# Patient Record
Sex: Female | Born: 1965
Health system: Southern US, Community
[De-identification: ages and names within clinical notes are randomized; demographics above are authoritative.]

## PROBLEM LIST (undated history)

## (undated) DIAGNOSIS — E785 Hyperlipidemia, unspecified: Secondary | ICD-10-CM

## (undated) DIAGNOSIS — J189 Pneumonia, unspecified organism: Secondary | ICD-10-CM

## (undated) DIAGNOSIS — K649 Unspecified hemorrhoids: Secondary | ICD-10-CM

## (undated) DIAGNOSIS — K219 Gastro-esophageal reflux disease without esophagitis: Secondary | ICD-10-CM

## (undated) HISTORY — DX: Hyperlipidemia, unspecified: E78.5

## (undated) HISTORY — DX: Unspecified hemorrhoids: K64.9

## (undated) HISTORY — DX: Gastro-esophageal reflux disease without esophagitis: K21.9

## (undated) HISTORY — PX: COLONOSCOPY: SHX174

## (undated) HISTORY — DX: Pneumonia, unspecified organism: J18.9

---

## 2006-05-27 ENCOUNTER — Emergency Department: Payer: Self-pay | Admitting: Emergency Medicine

## 2008-03-15 HISTORY — PX: PARTIAL HYSTERECTOMY: SHX80

## 2008-06-07 ENCOUNTER — Ambulatory Visit: Payer: Self-pay | Admitting: Unknown Physician Specialty

## 2008-06-13 ENCOUNTER — Ambulatory Visit: Payer: Self-pay | Admitting: Unknown Physician Specialty

## 2008-12-02 ENCOUNTER — Emergency Department: Payer: Self-pay | Admitting: Emergency Medicine

## 2009-11-13 ENCOUNTER — Ambulatory Visit: Payer: Self-pay | Admitting: Gastroenterology

## 2012-07-21 ENCOUNTER — Ambulatory Visit: Payer: Self-pay | Admitting: Family

## 2013-11-29 ENCOUNTER — Ambulatory Visit: Payer: Self-pay | Admitting: Family Medicine

## 2014-01-03 ENCOUNTER — Telehealth: Payer: Self-pay | Admitting: *Deleted

## 2014-01-03 ENCOUNTER — Encounter: Payer: Self-pay | Admitting: General Surgery

## 2014-01-03 NOTE — Telephone Encounter (Signed)
Needs appt per Dr Evette CristalSankar

## 2014-01-17 ENCOUNTER — Encounter: Payer: Self-pay | Admitting: General Surgery

## 2014-01-17 ENCOUNTER — Ambulatory Visit (INDEPENDENT_AMBULATORY_CARE_PROVIDER_SITE_OTHER): Payer: PRIVATE HEALTH INSURANCE | Admitting: General Surgery

## 2014-01-17 VITALS — BP 100/64 | HR 70 | Resp 12 | Ht 66.0 in | Wt 149.0 lb

## 2014-01-17 DIAGNOSIS — K648 Other hemorrhoids: Secondary | ICD-10-CM

## 2014-01-17 DIAGNOSIS — K644 Residual hemorrhoidal skin tags: Secondary | ICD-10-CM

## 2014-01-17 DIAGNOSIS — R58 Hemorrhage, not elsewhere classified: Secondary | ICD-10-CM

## 2014-01-17 MED ORDER — HYDROCORTISONE ACE-PRAMOXINE 2.5-1 % RE CREA: 1.0000 "application " | TOPICAL_CREAM | Freq: Two times a day (BID) | RECTAL | Status: DC

## 2014-01-17 NOTE — Progress Notes (Signed)
Patient ID: Charlotte Thompson, female   DOB: 09/23/1965, 48 y.o.   MRN: [id number]  Chief Complaint  Patient presents with  . Hemorrhoids    HPI Charlotte EpleySusan L Longbottom is a 48 y.o. female.  Here today for evaluation of bleeding hemorrhoids. She states she has had issues with them for many years. She has used the analpram cream and had run out. She does have issues with constipation and takes [place]amitiza which helps and her bowels move daily.   HPI  Past Medical History  Diagnosis Date  . Pneumonia   . GERD (gastroesophageal reflux disease)   . Hemorrhoid   . Hyperlipidemia     Past Surgical History  Procedure Laterality Date  . Partial hysterectomy  2010  . Colonoscopy      History reviewed. No pertinent family history.  Social History History  Substance Use Topics  . Smoking status: Former Smoker -- 16 years    Quit date: 03/15/2012  . Smokeless tobacco: Not on file  . Alcohol Use: No    Allergies  Allergen Reactions  . Codeine     Current Outpatient Prescriptions  Medication Sig Dispense Refill  . citalopram (CELEXA) 20 MG tablet Take 20 mg by mouth daily.    . Diphenhydramine-APAP, sleep, (PAIN RELIEF-SLEEP PM PO) Take by mouth as needed.    Tery Sanfilippo. Docusate Calcium (STOOL SOFTENER PO) Take by mouth as needed.    Marland Kitchen. levothyroxine (SYNTHROID, LEVOTHROID) 137 MCG tablet Take 137 mcg by mouth daily before breakfast.    . lubiprostone (AMITIZA) 24 MCG capsule Take 24 mcg by mouth 2 (two) times daily with a meal.    . Multiple Vitamin (MULTIVITAMIN) capsule Take 1 capsule by mouth daily.    . simvastatin (ZOCOR) 20 MG tablet Take 20 mg by mouth daily at 6 PM.    . hydrocortisone-pramoxine (ANALPRAM HC) 2.5-1 % rectal cream Place 1 application rectally 2 (two) times daily. 30 g 2   No current facility-administered medications for this visit.    Review of Systems Review of Systems  Constitutional: Negative.   Respiratory: Negative.   Cardiovascular: Negative.   Gastrointestinal:  Positive for constipation and blood in stool. Negative for diarrhea.    Blood pressure 100/64, pulse 70, resp. rate 12, height 5\' 6"  (1.676 m), weight 149 lb (67.586 kg).  Physical Exam Physical Exam  Constitutional: She is oriented to person, place, and time. She appears well-developed and well-nourished.  Eyes: Conjunctivae are normal. No scleral icterus.  Neck: Neck supple.  Genitourinary: Rectal exam shows external hemorrhoid and internal hemorrhoid.  External hemorrhoid at 11 o'clock accompanied by partially scarred internal hemorrhoid.  Lymphadenopathy:    She has no cervical adenopathy.  Neurological: She is alert and oriented to person, place, and time.  Skin: Skin is warm and dry.    Data Reviewed Office notes.  Assessment    External and internal hemorrhoid.     Plan    Discussed options- rubber band ligation or excision of both internal and external hemorrhoid. Pt will think about it. RX for Analpram 2.5 % cream as needed.      PCP:  Claudia PollockSundaram, Ashany Choua Ikner G 01/18/2014, 6:43 AM

## 2014-01-17 NOTE — Patient Instructions (Signed)
The patient is aware to call back for any questions or concerns.  

## 2014-01-18 ENCOUNTER — Encounter: Payer: Self-pay | Admitting: General Surgery

## 2014-01-23 ENCOUNTER — Ambulatory Visit (INDEPENDENT_AMBULATORY_CARE_PROVIDER_SITE_OTHER): Payer: PRIVATE HEALTH INSURANCE | Admitting: General Surgery

## 2014-01-23 ENCOUNTER — Encounter: Payer: Self-pay | Admitting: General Surgery

## 2014-01-23 VITALS — BP 130/72 | HR 74 | Resp 12 | Ht 66.0 in | Wt 149.0 lb

## 2014-01-23 DIAGNOSIS — K648 Other hemorrhoids: Secondary | ICD-10-CM

## 2014-01-23 DIAGNOSIS — K649 Unspecified hemorrhoids: Secondary | ICD-10-CM

## 2014-01-23 NOTE — Progress Notes (Signed)
This is a 48 year old female here today for hemorrhoid banding. Pt had mild improvement of symptom with Analpram.  She decided to have the internal hemorrhoid banded.  Pt in left lateral position. Anal area prepped with betadine. Internal hemorrhoid base at 11 ocl was infiltrated with 1 ml 1% xylocaine. The hemorrhoid was successfully banded.  No immediate problems.

## 2014-01-23 NOTE — Patient Instructions (Signed)
Patient to return in 3 weeks for follow up. The patient is aware to call back for any questions or concerns.  

## 2014-01-24 ENCOUNTER — Encounter: Payer: Self-pay | Admitting: General Surgery

## 2014-02-13 ENCOUNTER — Ambulatory Visit: Payer: PRIVATE HEALTH INSURANCE | Admitting: General Surgery

## 2014-08-30 ENCOUNTER — Other Ambulatory Visit: Payer: Self-pay

## 2014-08-30 ENCOUNTER — Telehealth: Payer: Self-pay | Admitting: Family Medicine

## 2014-08-30 DIAGNOSIS — N951 Menopausal and female climacteric states: Secondary | ICD-10-CM

## 2014-08-30 NOTE — Telephone Encounter (Signed)
Pt requesting a refill estradol. Please send to [facility]. She is completely out and there are no more refills at the pharmacy

## 2014-09-02 DIAGNOSIS — N951 Menopausal and female climacteric states: Secondary | ICD-10-CM | POA: Insufficient documentation

## 2014-09-02 MED ORDER — ESTRADIOL 0.5 MG PO TABS: 0.5000 mg | ORAL_TABLET | Freq: Every day | ORAL | Status: DC

## 2014-09-02 NOTE — Telephone Encounter (Signed)
Done

## 2014-10-18 ENCOUNTER — Ambulatory Visit (INDEPENDENT_AMBULATORY_CARE_PROVIDER_SITE_OTHER): Payer: PRIVATE HEALTH INSURANCE | Admitting: Family Medicine

## 2014-10-18 ENCOUNTER — Encounter: Payer: Self-pay | Admitting: Family Medicine

## 2014-10-18 VITALS — BP 102/68 | HR 67 | Temp 97.7°F | Resp 16 | Ht 66.0 in | Wt 146.9 lb

## 2014-10-18 DIAGNOSIS — L729 Follicular cyst of the skin and subcutaneous tissue, unspecified: Secondary | ICD-10-CM

## 2014-10-18 HISTORY — DX: Follicular cyst of the skin and subcutaneous tissue, unspecified: L72.9

## 2014-10-18 NOTE — Progress Notes (Signed)
Name: Charlotte Thompson   MRN: [id number]    DOB: 01/08/1966   Date:10/18/2014       Progress Note  Subjective  Chief Complaint  Chief Complaint  Patient presents with  . Cyst    patient has a knot on the left lateral side of her knee that she just noticed yesterday. Patient is concerned because her mother has a clotting disorder and she wants to make sure she does not have it also.    HPI  50 year old female with acute concern she recently felt a tender knot behind left knee to the lateral side. Her mother has had a clotting disorder so she wanted to have it evaluated. Not associated with claudication, warm, swelling, erythema, shortness of breath, recent trauma to the area.   Patient Active Problem List   Diagnosis Date Noted  . Hot flashes, menopausal 09/02/2014    History  Substance Use Topics  . Smoking status: Former Smoker -- 16 years    Quit date: 03/15/2012  . Smokeless tobacco: Not on file  . Alcohol Use: No     Current outpatient prescriptions:  .  citalopram (CELEXA) 20 MG tablet, Take 20 mg by mouth daily., Disp: , Rfl:  .  Diphenhydramine-APAP, sleep, (PAIN RELIEF-SLEEP PM PO), Take by mouth as needed., Disp: , Rfl:  .  hydrocortisone-pramoxine (ANALPRAM HC) 2.5-1 % rectal cream, Place 1 application rectally 2 (two) times daily., Disp: 30 g, Rfl: 2 .  levothyroxine (SYNTHROID, LEVOTHROID) 137 MCG tablet, Take 137 mcg by mouth daily before breakfast., Disp: , Rfl:  .  lubiprostone (AMITIZA) 24 MCG capsule, Take 24 mcg by mouth 2 (two) times daily with a meal., Disp: , Rfl:  .  Multiple Vitamin (MULTIVITAMIN) capsule, Take 1 capsule by mouth daily., Disp: , Rfl:  .  simvastatin (ZOCOR) 20 MG tablet, Take 20 mg by mouth daily at 6 PM., Disp: , Rfl:  .  VITAMIN D, ERGOCALCIFEROL, PO, Take by mouth., Disp: , Rfl:  .  estradiol (ESTRACE) 0.5 MG tablet, Take 1 tablet (0.5 mg total) by mouth daily., Disp: 21 tablet, Rfl: 5  Allergies  Allergen Reactions  . Codeine      Review of Systems  Ten systems reviewed and is negative except as mentioned in HPI.  Objective  BP 102/68 mmHg  Pulse 67  Temp(Src) 97.7 F (36.5 C) (Oral)  Resp 16  Ht  (1.676 m)  Wt 146 lb 14.4 oz (66.633 kg)  BMI 23.72 kg/m2  SpO2 98%  Body mass index is 23.72 kg/(m^2).   Physical Exam  Constitutional: Patient appears well-developed and well-nourished. In no distress.  HEENT:  - Head: Normocephalic and atraumatic.  - Ears: Bilateral TMs gray, no erythema or effusion - Nose: Nasal mucosa moist - Mouth/Throat: Oropharynx is clear and moist. No tonsillar hypertrophy or erythema. No post nasal drainage.  - Eyes: Conjunctivae clear, EOM movements normal. PERRLA. No scleral icterus.  Neck: Normal range of motion. Neck supple. No JVD present. No thyromegaly present.  Cardiovascular: Normal rate, regular rhythm and normal heart sounds.  No murmur heard.  Pulmonary/Chest: Effort normal and breath sounds normal. No respiratory distress. Musculoskeletal: Normal range of motion bilateral UE and LE, no joint effusions. No calf tenderness bilaterally with no swelling or erythema. Left posterior to knee just lateral and over quadriceps tendon a 0.5cm soft mildly tender mass palpated, freely mobile.  Peripheral vascular: Bilateral LE no edema. Neurological: CN II-XII grossly intact with no focal deficits. Alert  and oriented to person, place, and time. Coordination, balance, strength, speech and gait are normal.  Skin: Skin is warm and dry. No rash noted. No erythema.  Psychiatric: Patient has a normal mood and affect. Behavior is normal in office today. Judgment and thought content normal in office today.   No results found for this or any previous visit (from the past 2160 hour(s)).   Assessment & Plan  1. Benign cyst of skin Exam unremarkable for DVT. Localized soft mobile cystic structure over quadriceps tendon of left leg. Reassurance provided and discussed signs and  symptoms of DVT for future reference.

## 2014-11-04 ENCOUNTER — Other Ambulatory Visit: Payer: Self-pay | Admitting: Family Medicine

## 2014-11-04 DIAGNOSIS — E039 Hypothyroidism, unspecified: Secondary | ICD-10-CM

## 2014-11-04 DIAGNOSIS — K581 Irritable bowel syndrome with constipation: Secondary | ICD-10-CM

## 2014-11-04 MED ORDER — LUBIPROSTONE 24 MCG PO CAPS: 24.0000 ug | ORAL_CAPSULE | Freq: Two times a day (BID) | ORAL | Status: DC

## 2014-11-04 MED ORDER — LEVOTHYROXINE SODIUM 137 MCG PO TABS: 137.0000 ug | ORAL_TABLET | Freq: Every day | ORAL | Status: DC

## 2014-11-04 NOTE — Telephone Encounter (Signed)
Pt needs refill on Levothyroxine, Amitiza to be sent to [facility]. Pt states that the pharmacy faxed over request and states that she was told that we denied it due to her not being a pt here. Pt states it is ok to leave a message on her phone due to her being at work.

## 2014-11-04 NOTE — Telephone Encounter (Signed)
Refill request was sent to Dr. Ashany Sundaram for approval and submission.  

## 2014-11-08 ENCOUNTER — Other Ambulatory Visit: Payer: Self-pay | Admitting: Family Medicine

## 2014-11-08 DIAGNOSIS — E785 Hyperlipidemia, unspecified: Secondary | ICD-10-CM

## 2014-11-08 MED ORDER — SIMVASTATIN 20 MG PO TABS: 20.0000 mg | ORAL_TABLET | Freq: Every day | ORAL | Status: DC

## 2014-11-08 NOTE — Telephone Encounter (Signed)
Refill request was sent to Dr. Ashany Sundaram for approval and submission.  

## 2014-11-08 NOTE — Telephone Encounter (Signed)
Patient is completely out of Simvastatin, have been out for 3-4 days. States the pharmacy has tried to fax the request over but they keep getting a reply back saying that she was not a patient here. Please send to [facility]. Last seen here 10-18-14

## 2015-01-28 ENCOUNTER — Other Ambulatory Visit: Payer: Self-pay | Admitting: Family Medicine

## 2015-01-28 MED ORDER — CITALOPRAM HYDROBROMIDE 20 MG PO TABS: 20.0000 mg | ORAL_TABLET | Freq: Every day | ORAL | Status: DC

## 2015-01-28 NOTE — Telephone Encounter (Signed)
Refill request was sent to Dr. Ashany Sundaram for approval and submission.  

## 2015-01-28 NOTE — Telephone Encounter (Signed)
Pt needs refill on [facility]italopram [facility]

## 2015-04-30 ENCOUNTER — Other Ambulatory Visit: Payer: Self-pay | Admitting: Family Medicine

## 2015-06-16 ENCOUNTER — Encounter: Payer: Self-pay | Admitting: Family Medicine

## 2015-06-16 ENCOUNTER — Ambulatory Visit (INDEPENDENT_AMBULATORY_CARE_PROVIDER_SITE_OTHER): Payer: PRIVATE HEALTH INSURANCE | Admitting: Family Medicine

## 2015-06-16 ENCOUNTER — Other Ambulatory Visit: Payer: Self-pay

## 2015-06-16 VITALS — BP 116/72 | HR 89 | Temp 98.6°F | Resp 14 | Ht 65.0 in | Wt 141.0 lb

## 2015-06-16 DIAGNOSIS — Z841 Family history of disorders of kidney and ureter: Secondary | ICD-10-CM | POA: Insufficient documentation

## 2015-06-16 DIAGNOSIS — E038 Other specified hypothyroidism: Secondary | ICD-10-CM | POA: Diagnosis not present

## 2015-06-16 DIAGNOSIS — E7800 Pure hypercholesterolemia, unspecified: Secondary | ICD-10-CM | POA: Insufficient documentation

## 2015-06-16 DIAGNOSIS — R454 Irritability and anger: Secondary | ICD-10-CM

## 2015-06-16 DIAGNOSIS — Z862 Personal history of diseases of the blood and blood-forming organs and certain disorders involving the immune mechanism: Secondary | ICD-10-CM

## 2015-06-16 DIAGNOSIS — N951 Menopausal and female climacteric states: Secondary | ICD-10-CM

## 2015-06-16 DIAGNOSIS — R011 Cardiac murmur, unspecified: Secondary | ICD-10-CM

## 2015-06-16 DIAGNOSIS — E039 Hypothyroidism, unspecified: Secondary | ICD-10-CM | POA: Insufficient documentation

## 2015-06-16 DIAGNOSIS — Z720 Tobacco use: Secondary | ICD-10-CM | POA: Diagnosis not present

## 2015-06-16 DIAGNOSIS — Z5181 Encounter for therapeutic drug level monitoring: Secondary | ICD-10-CM

## 2015-06-16 HISTORY — DX: Family history of disorders of kidney and ureter: Z84.1

## 2015-06-16 HISTORY — DX: Cardiac murmur, unspecified: R01.1

## 2015-06-16 HISTORY — DX: Personal history of diseases of the blood and blood-forming organs and certain disorders involving the immune mechanism: Z86.2

## 2015-06-16 MED ORDER — CITALOPRAM HYDROBROMIDE 40 MG PO TABS: 40.0000 mg | ORAL_TABLET | Freq: Every day | ORAL | Status: DC

## 2015-06-16 NOTE — Patient Instructions (Addendum)
Let's get labs today We'll contact you with the results Try to limit saturated fats in your diet (bologna, hot dogs, barbeque, cheeseburgers, hamburgers, steak, bacon, sausage, cheese, etc.) and get more fresh fruits, vegetables, and whole grains Increase the citalopram and I'll see you back in 4 weeks, but call sooner if needed I do encourage you to quit smoking Call [phone] to sign up for smoking cessation classes You can call 1-800-QUIT-NOW to talk with a smoking cessation coach  Smoking Cessation, Tips for Success If you are ready to quit smoking, congratulations! You have chosen to help yourself be healthier. Cigarettes bring nicotine, tar, carbon monoxide, and other irritants into your body. Your lungs, heart, and blood vessels will be able to work better without these poisons. There are many different ways to quit smoking. Nicotine gum, nicotine patches, a nicotine inhaler, or nicotine nasal spray can help with physical craving. Hypnosis, support groups, and medicines help break the habit of smoking. WHAT THINGS CAN I DO TO MAKE QUITTING EASIER?  Here are some tips to help you quit for good:  Pick a date when you will quit smoking completely. Tell all of your friends and family about your plan to quit on that date.  Do not try to slowly cut down on the number of cigarettes you are smoking. Pick a quit date and quit smoking completely starting on that day.  Throw away all cigarettes.   Clean and remove all ashtrays from your home, work, and car.  On a card, write down your reasons for quitting. Carry the card with you and read it when you get the urge to smoke.  Cleanse your body of nicotine. Drink enough water and fluids to keep your urine clear or pale yellow. Do this after quitting to flush the nicotine from your body.  Learn to predict your moods. Do not let a bad situation be your excuse to have a cigarette. Some situations in your life might tempt you into wanting a  cigarette.  Never have "just one" cigarette. It leads to wanting another and another. Remind yourself of your decision to quit.  Change habits associated with smoking. If you smoked while driving or when feeling stressed, try other activities to replace smoking. Stand up when drinking your coffee. Brush your teeth after eating. Sit in a different chair when you read the paper. Avoid alcohol while trying to quit, and try to drink fewer caffeinated beverages. Alcohol and caffeine may urge you to smoke.  Avoid foods and drinks that can trigger a desire to smoke, such as sugary or spicy foods and alcohol.  Ask people who smoke not to smoke around you.  Have something planned to do right after eating or having a cup of coffee. For example, plan to take a walk or exercise.  Try a relaxation exercise to calm you down and decrease your stress. Remember, you may be tense and nervous for the first 2 weeks after you quit, but this will pass.  Find new activities to keep your hands busy. Play with a pen, coin, or rubber band. Doodle or draw things on paper.  Brush your teeth right after eating. This will help cut down on the craving for the taste of tobacco after meals. You can also try mouthwash.   Use oral substitutes in place of cigarettes. Try using lemon drops, carrots, cinnamon sticks, or chewing gum. Keep them handy so they are available when you have the urge to smoke.  When you have the  urge to smoke, try deep breathing.  Designate your home as a nonsmoking area.  If you are a heavy smoker, ask your health care provider about a prescription for nicotine chewing gum. It can ease your withdrawal from nicotine.  Reward yourself. Set aside the cigarette money you save and buy yourself something nice.  Look for support from others. Join a support group or smoking cessation program. Ask someone at home or at work to help you with your plan to quit smoking.  Always ask yourself, "Do I need this  cigarette or is this just a reflex?" Tell yourself, "Today, I choose not to smoke," or "I do not want to smoke." You are reminding yourself of your decision to quit.  Do not replace cigarette smoking with electronic cigarettes (commonly called e-cigarettes). The safety of e-cigarettes is unknown, and some may contain harmful chemicals.  If you relapse, do not give up! Plan ahead and think about what you will do the next time you get the urge to smoke. HOW WILL I FEEL WHEN I QUIT SMOKING? You may have symptoms of withdrawal because your body is used to nicotine (the addictive substance in cigarettes). You may crave cigarettes, be irritable, feel very hungry, cough often, get headaches, or have difficulty concentrating. The withdrawal symptoms are only temporary. They are strongest when you first quit but will go away within 10-14 days. When withdrawal symptoms occur, stay in control. Think about your reasons for quitting. Remind yourself that these are signs that your body is healing and getting used to being without cigarettes. Remember that withdrawal symptoms are easier to treat than the major diseases that smoking can cause.  Even after the withdrawal is over, expect periodic urges to smoke. However, these cravings are generally short lived and will go away whether you smoke or not. Do not smoke! WHAT RESOURCES ARE AVAILABLE TO HELP ME QUIT SMOKING? Your health care provider can direct you to community resources or hospitals for support, which may include:  Group support.  Education.  Hypnosis.  Therapy.   This information is not intended to replace advice given to you by your health care provider. Make sure you discuss any questions you have with your health care provider.   Document Released: 11/28/2003 Document Revised: 03/22/2014 Document Reviewed: 08/17/2012 Elsevier Interactive Patient Education [employer]

## 2015-06-16 NOTE — Progress Notes (Signed)
BP 116/72 mmHg  Pulse 89  Temp(Src) 98.6 F (37 C) (Oral)  Resp 14  Ht  (1.651 m)  Wt 141 lb (63.957 kg)  BMI 23.46 kg/m2  SpO2 98%   Subjective:    Patient ID: Charlotte Thompson, female    DOB: 1965-08-22, 50 y.o.   MRN: [id number]  HPI: Charlotte Thompson is a 50 y.o. female  Chief Complaint  Patient presents with  . Medication Refill  . Hypothyroidism    Having alot of Fatigue levels may be off  . Depression    citalopram not helping  . Hyperlipidemia    She would like to have her thyroid checked; no energy to do anything; has been on the same dose of thyroid medicine for 10 years; on medicine after childbirth 23 years ago; uses Amitiza; no changes in hair or skin; sleep has been poor; using otc; more trouble with staying asleep; can feel asleep   Doesn't think the citalopram may not be working; so ill, I can't stand myself much less other people; has been on 20 mg citalopram for a while; several years; no side effects; she is game to increasing it  She started taking smoking again, one year ago; stopped estrogen because she has heard about risks  High cholesterol; on statin but has muscle aches really bad; quit smoking and then her cholesterol went sky high; also coincided with onset of menopause; trying to eat healthy; fruits, peanut butter  Relevant past medical, surgical, family and social history reviewed and updated as indicated  Family History  Problem Relation Age of Onset  . Clotting disorder Mother   . Thyroid disease Sister   mother is on dialysis, Dr. Thedore Mins; brother has renal disease as well High cholesterol runs in the family Sister has breast cancer  Allergies and medications reviewed and updated.  Review of Systems Per HPI unless specifically indicated above     Objective:    BP 116/72 mmHg  Pulse 89  Temp(Src) 98.6 F (37 C) (Oral)  Resp 14  Ht  (1.651 m)  Wt 141 lb (63.957 kg)  BMI 23.46 kg/m2  SpO2 98%  Wt Readings from Last  3 Encounters:  06/16/15 141 lb (63.957 kg)  10/18/14 146 lb 14.4 oz (66.633 kg)  01/23/14 149 lb (67.586 kg)    Physical Exam  Constitutional: She appears well-developed and well-nourished.  Weight down 5 pounds over last 8 months  Eyes: No scleral icterus.  Neck: No JVD present. No thyromegaly present.  Cardiovascular: Normal rate and regular rhythm.   Murmur heard.  Systolic murmur is present with a grade of 2/6  Pulmonary/Chest: Effort normal and breath sounds normal.  Musculoskeletal: She exhibits no edema.  Neurological: She is alert. She displays no tremor.  No tics  Skin: Skin is warm and dry. No erythema. No pallor.  Psychiatric: She has a normal mood and affect.      Assessment & Plan:   Problem List Items Addressed This Visit      Endocrine   Hypothyroidism - Primary    Check TSH; she has had weight loss and fatigue; adjust dose of medicine if indicated      Relevant Orders   TSH     Other   Hot flashes, menopausal (Chronic)    Increase the SSRI, may have some benefit here      Tobacco abuse    Encourage dpatient to consider quitting; I am here to help if I may  serve in that capacity      Hypercholesterolemia    Check lipid panel      Relevant Orders   Lipid Panel w/o Chol/HDL Ratio   Medication monitoring encounter   Relevant Orders   Comprehensive metabolic panel   Family history of chronic renal disease    Check creatinine and urine microalbumin      Relevant Orders   Microalbumin / creatinine urine ratio   Hx of iron deficiency anemia   Relevant Orders   CBC with Differential/Platelet   Heart murmur    discussed benign sounding murmur with patient; she does not recall ever having had an echo, so we'll order one today; also order TSH and CBC (r/o hyperthyroidism and anemia)      Relevant Orders   Echocardiogram   Irritability    Increase SSRI; reassess in 4 weeks         Follow up plan: Return in about 4 weeks (around 07/14/2015) for  follow-up.  Orders Placed This Encounter  Procedures  . CBC with Differential/Platelet  . Lipid Panel w/o Chol/HDL Ratio  . TSH  . Microalbumin / creatinine urine ratio  . Comprehensive metabolic panel  . Echocardiogram   An after-visit summary was printed and given to the patient at check-out.  Please see the patient instructions which may contain other information and recommendations beyond what is mentioned above in the assessment and plan.  Meds ordered this encounter  Medications  . Ginger, Zingiber officinalis, (GINGER ROOT) 550 MG CAPS    Sig: Take 550 mg by mouth daily.  . citalopram (CELEXA) 40 MG tablet    Sig: Take 1 tablet (40 mg total) by mouth daily.    Dispense:  30 tablet    Refill:  3

## 2015-06-17 LAB — COMPREHENSIVE METABOLIC PANEL
ALT: 22 IU/L (ref 0–32)
AST: 23 IU/L (ref 0–40)
Albumin/Globulin Ratio: 2 (ref 1.2–2.2)
Albumin: 4.3 g/dL (ref 3.5–5.5)
Alkaline Phosphatase: 88 IU/L (ref 39–117)
BUN/Creatinine Ratio: 24 — ABNORMAL HIGH (ref 9–23)
BUN: 15 mg/dL (ref 6–24)
Bilirubin Total: <0.2 mg/dL (ref 0.0–1.2)
CO2: 24 mmol/L (ref 18–29)
Calcium: 9.9 mg/dL (ref 8.7–10.2)
Chloride: 103 mmol/L (ref 96–106)
Creatinine, Ser: 0.62 mg/dL (ref 0.57–1.00)
GFR calc Af Amer: 123 mL/min/{1.73_m2} (ref >59)
GFR calc non Af Amer: 107 mL/min/{1.73_m2} (ref >59)
Globulin, Total: 2.1 g/dL (ref 1.5–4.5)
Glucose: 90 mg/dL (ref 65–99)
Potassium: 5.6 mmol/L — ABNORMAL HIGH (ref 3.5–5.2)
Sodium: 144 mmol/L (ref 134–144)
Total Protein: 6.4 g/dL (ref 6.0–8.5)

## 2015-06-17 LAB — MICROALBUMIN / CREATININE URINE RATIO
Creatinine, Urine: 14.4 mg/dL
MICROALB/CREAT RATIO: 60.4 mg/g{creat} — ABNORMAL HIGH (ref 0.0–30.0)
Microalbumin, Urine: 8.7 ug/mL

## 2015-06-17 LAB — CBC WITH DIFFERENTIAL/PLATELET
Basophils Absolute: 0 10*3/uL (ref 0.0–0.2)
Basos: 0 %
EOS (ABSOLUTE): 0.2 10*3/uL (ref 0.0–0.4)
Eos: 2 %
Hematocrit: 36.7 % (ref 34.0–46.6)
Hemoglobin: 12.6 g/dL (ref 11.1–15.9)
Immature Grans (Abs): 0 10*3/uL (ref 0.0–0.1)
Immature Granulocytes: 0 %
Lymphocytes Absolute: 2.1 10*3/uL (ref 0.7–3.1)
Lymphs: 28 %
MCH: 31.7 pg (ref 26.6–33.0)
MCHC: 34.3 g/dL (ref 31.5–35.7)
MCV: 92 fL (ref 79–97)
Monocytes Absolute: 0.6 10*3/uL (ref 0.1–0.9)
Monocytes: 8 %
Neutrophils Absolute: 4.8 10*3/uL (ref 1.4–7.0)
Neutrophils: 62 %
Platelets: 221 10*3/uL (ref 150–379)
RBC: 3.97 x10E6/uL (ref 3.77–5.28)
RDW: 13.3 % (ref 12.3–15.4)
WBC: 7.6 10*3/uL (ref 3.4–10.8)

## 2015-06-17 LAB — LIPID PANEL W/O CHOL/HDL RATIO
Cholesterol, Total: 154 mg/dL (ref 100–199)
HDL: 45 mg/dL (ref >39)
LDL Calculated: 81 mg/dL (ref 0–99)
Triglycerides: 141 mg/dL (ref 0–149)
VLDL Cholesterol Cal: 28 mg/dL (ref 5–40)

## 2015-06-17 LAB — TSH: TSH: 0.06 u[IU]/mL — ABNORMAL LOW (ref 0.450–4.500)

## 2015-06-18 ENCOUNTER — Telehealth: Payer: Self-pay | Admitting: Family Medicine

## 2015-06-18 DIAGNOSIS — E875 Hyperkalemia: Secondary | ICD-10-CM

## 2015-06-18 MED ORDER — LEVOTHYROXINE SODIUM 125 MCG PO TABS: 125.0000 ug | ORAL_TABLET | Freq: Every day | ORAL | Status: DC

## 2015-06-18 NOTE — Telephone Encounter (Signed)
Reviewed labs; K+ slightly elevated; may be hemolysis; recheck in the next few days; she does not use salt substitutes; not on ACE-I or ARB Lipids look excellent; CBC normal TSH abnormal; need to adjust dose; change and recheck TSH in 6 weeks (future flag to self to order TSH, so as to not confuse upcoming K+ lab draw due in the next few days) Sublingual B12 suggested; she'd rather try the vitamin than draw labs She is feeling better on higher dose of citalopram already

## 2015-06-21 DIAGNOSIS — R454 Irritability and anger: Secondary | ICD-10-CM | POA: Insufficient documentation

## 2015-06-21 NOTE — Assessment & Plan Note (Addendum)
discussed benign sounding murmur with patient; she does not recall ever having had an echo, so we'll order one today; also order TSH and CBC (r/o hyperthyroidism and anemia)

## 2015-06-21 NOTE — Assessment & Plan Note (Signed)
Check creatinine and urine microalbumin

## 2015-06-21 NOTE — Assessment & Plan Note (Signed)
Check lipid panel  

## 2015-06-21 NOTE — Assessment & Plan Note (Signed)
Check TSH; she has had weight loss and fatigue; adjust dose of medicine if indicated

## 2015-06-21 NOTE — Assessment & Plan Note (Signed)
Encourage dpatient to consider quitting; I am here to help if I may serve in that capacity

## 2015-06-21 NOTE — Assessment & Plan Note (Signed)
Increase the SSRI, may have some benefit here

## 2015-06-21 NOTE — Assessment & Plan Note (Signed)
Increase SSRI; reassess in 4 weeks

## 2015-07-14 ENCOUNTER — Other Ambulatory Visit
Admission: RE | Admit: 2015-07-14 | Discharge: 2015-07-14 | Disposition: A | Discharge status: Home / Self Care | Payer: PRIVATE HEALTH INSURANCE | Source: Ambulatory Visit | Attending: Family Medicine | Admitting: Family Medicine

## 2015-07-14 ENCOUNTER — Ambulatory Visit (INDEPENDENT_AMBULATORY_CARE_PROVIDER_SITE_OTHER): Payer: PRIVATE HEALTH INSURANCE | Admitting: Family Medicine

## 2015-07-14 ENCOUNTER — Encounter: Payer: Self-pay | Admitting: Family Medicine

## 2015-07-14 VITALS — BP 116/64 | HR 90 | Temp 98.8°F | Resp 14 | Wt 140.0 lb

## 2015-07-14 DIAGNOSIS — E875 Hyperkalemia: Secondary | ICD-10-CM | POA: Diagnosis not present

## 2015-07-14 DIAGNOSIS — E7800 Pure hypercholesterolemia, unspecified: Secondary | ICD-10-CM

## 2015-07-14 DIAGNOSIS — R454 Irritability and anger: Secondary | ICD-10-CM | POA: Diagnosis not present

## 2015-07-14 DIAGNOSIS — R011 Cardiac murmur, unspecified: Secondary | ICD-10-CM | POA: Diagnosis not present

## 2015-07-14 DIAGNOSIS — Z720 Tobacco use: Secondary | ICD-10-CM

## 2015-07-14 LAB — POTASSIUM: Potassium: 3.7 mmol/L (ref 3.5–5.1)

## 2015-07-14 NOTE — Assessment & Plan Note (Signed)
Murmur auscultated at last visit; echocardiogram scheduled for Wednesday

## 2015-07-14 NOTE — Patient Instructions (Addendum)
Try to limit saturated fats in your diet (bologna, hot dogs, barbeque, cheeseburgers, hamburgers, steak, bacon, sausage, cheese, etc.) and get more fresh fruits, vegetables, and whole grains  Check out the information at familydoctor.org entitled "What It Takes to Lose Weight" Try to lose between 1-2 pounds per week by taking in fewer calories and burning off more calories You can succeed by limiting portions, limiting foods dense in calories and fat, becoming more active, and drinking 8 glasses of water a day (64 ounces) Don't skip meals, especially breakfast, as skipping meals may alter your metabolism Do not use over-the-counter weight loss pills or gimmicks that claim rapid weight loss A healthy BMI (or body mass index) is between 18.5 and 24.9 You can calculate your ideal BMI at the NIH website [employer]huhttp://www.nhlbi.nih.gov/health/educational/lose_wt/BMI/bmicalc.htm  You may stop the statin and recheck fasting lipids in 12 weeks  Please have the potassium redrawn today  Have thyroid drawn on or after May 17th  I do encourage you to quit smoking Call [phone] to sign up for FREE smoking cessation classes You can call 1-800-QUIT-NOW to talk with a smoking cessation coach Try the straw therapy

## 2015-07-14 NOTE — Assessment & Plan Note (Signed)
Patient wishes to stop the statin and with my blessing, we will have her eat right and exercise and recheck fasting lipids in 12 weeks; more fiber

## 2015-07-14 NOTE — Assessment & Plan Note (Addendum)
SSRI was increased from 20 mg to 40 mg 4 weeks ago; consider changing in increments of 10 mg when tapering up or down

## 2015-07-14 NOTE — Assessment & Plan Note (Signed)
Encouraged patient to quit smoking; gave her numbers to call; discussed how to avoid weight gain after smoking cessation

## 2015-07-14 NOTE — Progress Notes (Signed)
BP 116/64 mmHg  Pulse 90  Temp(Src) 98.8 F (37.1 C) (Oral)  Resp 14  Wt 140 lb (63.504 kg)  SpO2 95%   Subjective:    Patient ID: Charlotte Thompson, female    DOB: 06-Aug-1965, 50 y.o.   MRN: [id number]  HPI: Charlotte Thompson is a 50 y.o. female  Chief Complaint  Patient presents with  . Follow-up   Patient was seen on April 3rd; here for f/u today; last note reviewed  At the last visit, her citalopram was increased from 20 mg daily to 40 mg daily; she is feeling better; it takes a little bit to get used to the higher dose; usually has outgoing bubbly personality; she is having an extremely good day today  She was found to have a heart murmur; I ordered an echo last visit and she is going to have that done on Wednesday  She had an elevated K+ at last draw; no salt substitutes, no fruit smoothies  The medicine she takes for her cholesterol, she wants to talk about stopping her medicine; she is going to the gym six days a week (skips Sunday)  She wants to try to quit smoking; she quit smoking for 2 years; she weighed 135 pounds and within two years she gained about 40 pounds after quitting smoking; she was not going to the gym then, but she tried to monitor her foods; she has not worked with a nutritionist  TSH was 0.06 on April 3rd; dose was adjusted to 125 mcg;   Depression screen [facility] 2/9 06/16/2015 10/18/2014  Decreased Interest 0 1  Down, Depressed, Hopeless 1 1  PHQ - 2 Score 1 2  Altered sleeping - 3  Tired, decreased energy - 1  Change in appetite - 0  Feeling bad or failure about yourself  - 0  Trouble concentrating - 1  Moving slowly or fidgety/restless - 0  Suicidal thoughts - 0  PHQ-9 Score - 7   Relevant past medical, surgical, family and social history reviewed Past Medical History  Diagnosis Date  . Pneumonia   . GERD (gastroesophageal reflux disease)   . Hemorrhoid   . Hyperlipidemia    Past Surgical History  Procedure Laterality Date  . Partial  hysterectomy  2010  . Colonoscopy     Family History  Problem Relation Age of Onset  . Clotting disorder Mother   . Thyroid disease Sister    Social History  Substance Use Topics  . Smoking status: Former Smoker -- 16 years    Quit date: 03/15/2012  . Smokeless tobacco: None  . Alcohol Use: No    Interim medical history since last visit reviewed. Allergies and medications reviewed  Review of Systems Per HPI unless specifically indicated above     Objective:    BP 116/64 mmHg  Pulse 90  Temp(Src) 98.8 F (37.1 C) (Oral)  Resp 14  Wt 140 lb (63.504 kg)  SpO2 95%  Wt Readings from Last 3 Encounters:  07/14/15 140 lb (63.504 kg)  06/16/15 141 lb (63.957 kg)  10/18/14 146 lb 14.4 oz (66.633 kg)    Physical Exam  Constitutional: She appears well-developed and well-nourished.  Weight down 1 pound since last visit  Eyes: EOM are normal. No scleral icterus.  No proptosis  Neck: No JVD present. No thyromegaly present.  Cardiovascular: Normal rate and regular rhythm.   Murmur heard.  Systolic murmur is present with a grade of 2/6  Pulmonary/Chest: Effort normal and breath  sounds normal. She has no wheezes.  Neurological: She is alert. She displays no tremor. Gait normal.  No tics  Skin: Skin is warm and dry. She is not diaphoretic. No erythema. No pallor.  Psychiatric: She has a normal mood and affect.  Talkative, high energy, but redirectable    Results for orders placed or performed in visit on 06/16/15  CBC with Differential/Platelet  Result Value Ref Range   WBC 7.6 3.4 - 10.8 x10E3/uL   RBC 3.97 3.77 - 5.28 x10E6/uL   Hemoglobin 12.6 11.1 - 15.9 g/dL   Hematocrit 14.736.7 82.934.0 - 46.6 %   MCV 92 79 - 97 fL   MCH 31.7 26.6 - 33.0 pg   MCHC 34.3 31.5 - 35.7 g/dL   RDW 56.213.3 13.012.3 - 86.515.4 %   Platelets 221 150 - 379 x10E3/uL   Neutrophils 62 %   Lymphs 28 %   Monocytes 8 %   Eos 2 %   Basos 0 %   Neutrophils Absolute 4.8 1.4 - 7.0 x10E3/uL   Lymphocytes Absolute  2.1 0.7 - 3.1 x10E3/uL   Monocytes Absolute 0.6 0.1 - 0.9 x10E3/uL   EOS (ABSOLUTE) 0.2 0.0 - 0.4 x10E3/uL   Basophils Absolute 0.0 0.0 - 0.2 x10E3/uL   Immature Granulocytes 0 %   Immature Grans (Abs) 0.0 0.0 - 0.1 x10E3/uL  Comprehensive metabolic panel  Result Value Ref Range   Glucose 90 65 - 99 mg/dL   BUN 15 6 - 24 mg/dL   Creatinine, Ser 7.840.62 0.57 - 1.00 mg/dL   GFR calc non Af Amer 107 >59 mL/min/1.73   GFR calc Af Amer 123 >59 mL/min/1.73   BUN/Creatinine Ratio 24 (H) 9 - 23   Sodium 144 134 - 144 mmol/L   Potassium 5.6 (H) 3.5 - 5.2 mmol/L   Chloride 103 96 - 106 mmol/L   CO2 24 18 - 29 mmol/L   Calcium 9.9 8.7 - 10.2 mg/dL   Total Protein 6.4 6.0 - 8.5 g/dL   Albumin 4.3 3.5 - 5.5 g/dL   Globulin, Total 2.1 1.5 - 4.5 g/dL   Albumin/Globulin Ratio 2.0 1.2 - 2.2   Bilirubin Total <0.2 0.0 - 1.2 mg/dL   Alkaline Phosphatase 88 39 - 117 IU/L   AST 23 0 - 40 IU/L   ALT 22 0 - 32 IU/L  Lipid Panel w/o Chol/HDL Ratio  Result Value Ref Range   Cholesterol, Total 154 100 - 199 mg/dL   Triglycerides 696141 0 - 149 mg/dL   HDL 45 >29>39 mg/dL   VLDL Cholesterol Cal 28 5 - 40 mg/dL   LDL Calculated 81 0 - 99 mg/dL  Microalbumin / creatinine urine ratio  Result Value Ref Range   Creatinine, Urine 14.4 Not Estab. mg/dL   Microalbum.,U,Random 8.7 Not Estab. ug/mL   MICROALB/CREAT RATIO 60.4 (H) 0.0 - 30.0 mg/g creat  TSH  Result Value Ref Range   TSH 0.060 (L) 0.450 - 4.500 uIU/mL      Assessment & Plan:   Problem List Items Addressed This Visit      Other   Tobacco abuse    Encouraged patient to quit smoking; gave her numbers to call; discussed how to avoid weight gain after smoking cessation      Hypercholesterolemia    Patient wishes to stop the statin and with my blessing, we will have her eat right and exercise and recheck fasting lipids in 12 weeks; more fiber      Heart murmur  Murmur auscultated at last visit; echocardiogram scheduled for Wednesday       Hyperkalemia   Relevant Orders   TSH   Irritability - Primary    SSRI was increased from 20 mg to 40 mg 4 weeks ago; consider changing in increments of 10 mg when tapering up or down         Talked with patient about how to avoid weight gain when she quits smoking; see AVS  Face-to-face time with patient was more than 25 minutes, >50% time spent counseling and coordination of care  Follow up plan: Return if symptoms worsen or fail to improve.  An after-visit summary was printed and given to the patient at check-out.  Please see the patient instructions which may contain other information and recommendations beyond what is mentioned above in the assessment and plan.  Orders Placed This Encounter  Procedures  . TSH

## 2015-07-16 ENCOUNTER — Ambulatory Visit
Admission: RE | Admit: 2015-07-16 | Discharge: 2015-07-16 | Disposition: A | Discharge status: Home / Self Care | Payer: PRIVATE HEALTH INSURANCE | Source: Ambulatory Visit | Attending: Family Medicine | Admitting: Family Medicine

## 2015-07-16 DIAGNOSIS — E785 Hyperlipidemia, unspecified: Secondary | ICD-10-CM | POA: Insufficient documentation

## 2015-07-16 DIAGNOSIS — R011 Cardiac murmur, unspecified: Secondary | ICD-10-CM | POA: Diagnosis not present

## 2015-07-16 DIAGNOSIS — K219 Gastro-esophageal reflux disease without esophagitis: Secondary | ICD-10-CM | POA: Diagnosis not present

## 2015-07-16 NOTE — Progress Notes (Signed)
*  PRELIMINARY RESULTS* Echocardiogram 2D Echocardiogram has been performed.  Charlotte HousekeeperJerry R Thompson 07/16/2015, 10:29 AM

## 2015-07-28 ENCOUNTER — Other Ambulatory Visit: Payer: Self-pay

## 2015-07-29 MED ORDER — LUBIPROSTONE 24 MCG PO CAPS: 24.0000 ug | ORAL_CAPSULE | Freq: Two times a day (BID) | ORAL | Status: DC

## 2015-07-31 ENCOUNTER — Telehealth: Payer: Self-pay | Admitting: Family Medicine

## 2015-07-31 MED ORDER — CITALOPRAM HYDROBROMIDE 20 MG PO TABS: 30.0000 mg | ORAL_TABLET | Freq: Every day | ORAL | Status: DC

## 2015-07-31 NOTE — Telephone Encounter (Signed)
Yes, I've sent a new prescription for the 20 mg pills and she can take 1-1/2 of those daily to equal 30 mg Please make sure when she says "too much" that she isn't manic; find out if she's okay or if I need to speak with her Thanks

## 2015-07-31 NOTE — Telephone Encounter (Signed)
Left voice mail

## 2015-07-31 NOTE — Telephone Encounter (Signed)
YOU HAD UP THE DOSAGE OF HER CITALAPRAM 40MG  AND WANTS TO KNOW IF MAYBE IF YOU COULD TAKE HER DOWN TO 30MG . THE 40 IS TO MUCH AND THE 20 IS NOT ENOUGH. [facility] [address].

## 2015-09-11 ENCOUNTER — Other Ambulatory Visit: Payer: Self-pay

## 2015-09-11 DIAGNOSIS — E038 Other specified hypothyroidism: Secondary | ICD-10-CM

## 2015-09-11 MED ORDER — LEVOTHYROXINE SODIUM 125 MCG PO TABS: 125.0000 ug | ORAL_TABLET | Freq: Every day | ORAL | Status: DC

## 2015-09-11 NOTE — Assessment & Plan Note (Signed)
Recheck tsh

## 2015-09-11 NOTE — Telephone Encounter (Signed)
Patient is overdue for recheck of her thyroid Please have her get that done pronto and I'll send one week to allow for testing and results to get back to me I am cancelling old order from May and I put in new ones for either [facility] or [facility]; cancel whichever one she does NOT want to use; thanks

## 2015-09-12 NOTE — Telephone Encounter (Signed)
Pt.notified

## 2015-10-29 ENCOUNTER — Other Ambulatory Visit: Payer: Self-pay | Admitting: Family Medicine

## 2015-11-29 ENCOUNTER — Other Ambulatory Visit: Payer: Self-pay | Admitting: Family Medicine

## 2015-11-30 NOTE — Telephone Encounter (Signed)
Patient is really overdue for thyroid recheck Please just ask her to make an appt with me I'll allow two weeks of medicine citalopram, but we really want to see her now

## 2015-12-01 NOTE — Telephone Encounter (Signed)
Patient has appt for 12-04-15

## 2015-12-02 ENCOUNTER — Telehealth: Payer: Self-pay

## 2015-12-02 NOTE — Telephone Encounter (Signed)
Rayann HemanShirley Smith left v/m requesting call to Lovette ClicheSusan Mccorvey. Left v/m requesting pt to cb.

## 2015-12-03 NOTE — Telephone Encounter (Signed)
Darl PikesSusan left v/m requesting cb about her mother. Disregard this phone note.

## 2015-12-04 ENCOUNTER — Ambulatory Visit (INDEPENDENT_AMBULATORY_CARE_PROVIDER_SITE_OTHER): Payer: PRIVATE HEALTH INSURANCE | Admitting: Family Medicine

## 2015-12-04 DIAGNOSIS — Z720 Tobacco use: Secondary | ICD-10-CM | POA: Diagnosis not present

## 2015-12-04 DIAGNOSIS — F418 Other specified anxiety disorders: Secondary | ICD-10-CM | POA: Diagnosis not present

## 2015-12-04 DIAGNOSIS — N951 Menopausal and female climacteric states: Secondary | ICD-10-CM | POA: Diagnosis not present

## 2015-12-04 DIAGNOSIS — E038 Other specified hypothyroidism: Secondary | ICD-10-CM

## 2015-12-04 DIAGNOSIS — F32A Depression, unspecified: Secondary | ICD-10-CM

## 2015-12-04 DIAGNOSIS — F419 Anxiety disorder, unspecified: Secondary | ICD-10-CM

## 2015-12-04 DIAGNOSIS — F329 Major depressive disorder, single episode, unspecified: Secondary | ICD-10-CM

## 2015-12-04 LAB — TSH: TSH: 0.33 mIU/L — ABNORMAL LOW

## 2015-12-04 LAB — T4, FREE: Free T4: 1.5 ng/dL (ref 0.8–1.8)

## 2015-12-04 MED ORDER — VENLAFAXINE HCL ER 75 MG PO CP24: ORAL_CAPSULE | ORAL | 0 refills | Status: DC

## 2015-12-04 MED ORDER — TRAZODONE HCL 50 MG PO TABS: 50.0000 mg | ORAL_TABLET | Freq: Every evening | ORAL | 3 refills | Status: DC | PRN

## 2015-12-04 NOTE — Patient Instructions (Signed)
Stop citalopram Start the Effexor, and go up after one week to 150 mg daily (take in the morning) Use the trazodone at night if needed We'll get labs today  12 Ways to Curb Anxiety  ?Anxiety is normal human sensation. It is what helped our ancestors survive the pitfalls of the wilderness. Anxiety is defined as experiencing worry or nervousness about an imminent event or something with an uncertain outcome. It is a feeling experienced by most people at some point in their lives. Anxiety can be triggered by a very personal issue, such as the illness of a loved one, or an event of global proportions, such as a refugee crisis. Some of the symptoms of anxiety are:  Feeling restless.  Having a feeling of impending danger.  Increased heart rate.  Rapid breathing. Sweating.  Shaking.  Weakness or feeling tired.  Difficulty concentrating on anything except the current worry.  Insomnia.  Stomach or bowel problems. What can we do about anxiety we may be feeling? There are many techniques to help manage stress and relax. Here are 12 ways you can reduce your anxiety almost immediately: 1. Turn off the constant feed of information. Take a social media sabbatical. Studies have shown that social media directly contributes to social anxiety.  2. Monitor your television viewing habits. Are you watching shows that are also contributing to your anxiety, such as 24-hour news stations? Try watching something else, or better yet, nothing at all. Instead, listen to music, read an inspirational book or practice a hobby. 3. Eat nutritious meals. Also, don't skip meals and keep healthful snacks on hand. Hunger and poor diet contributes to feeling anxious. 4. Sleep. Sleeping on a regular schedule for at least seven to eight hours a night will do wonders for your outlook when you are awake. 5. Exercise. Regular exercise will help rid your body of that anxious energy and help you get more restful sleep. 6. Try deep  (diaphragmatic) breathing. Inhale slowly through your nose for five seconds and exhale through your mouth. 7. Practice acceptance and gratitude. When anxiety hits, accept that there are things out of your control that shouldn't be of immediate concern.  8. Seek out humor. When anxiety strikes, watch a funny video, read jokes or call a friend who makes you laugh. Laughter is healing for our bodies and releases endorphins that are calming. 9. Stay positive. Take the effort to replace negative thoughts with positive ones. Try to see a stressful situation in a positive light. Try to come up with solutions rather than dwelling on the problem. 10. Figure out what triggers your anxiety. Keep a journal and make note of anxious moments and the events surrounding them. This will help you identify triggers you can avoid or even eliminate. 11. Talk to someone. Let a trusted friend, family member or even trained professional know that you are feeling overwhelmed and anxious. Verbalize what you are feeling and why.  12. Volunteer. If your anxiety is triggered by a crisis on a large scale, become an advocate and work to resolve the problem that is causing you unease. Anxiety is often unwelcome and can become overwhelming. If not kept in check, it can become a disorder that could require medical treatment. However, if you take the time to care for yourself and avoid the triggers that make you anxious, you will be able to find moments of relaxation and clarity that make your life much more enjoyable.   Steps to Elicit the Relaxation Response The  following is the technique reprinted with permission from Dr. Billy FischerHerbert Benson's book The Relaxation Response pages 162-163 1. Sit quietly in a comfortable position. 2. Close your eyes. 3. Deeply relax all your muscles,  beginning at your feet and progressing up to your face.  Keep them relaxed. 4. Breathe through your nose.  Become aware of your breathing.  As you breathe  out, say the word, "one"*,  silently to yourself. For example,  breathe in ... out, "one",- in .. out, "one", etc.  Breathe easily and naturally. 5. Continue for 10 to 20 minutes.  You may open your eyes to check the time, but do not use an alarm.  When you finish, sit quietly for several minutes,  at first with your eyes closed and later with your eyes opened.  Do not stand up for a few minutes. 6. Do not worry about whether you are successful  in achieving a deep level of relaxation.  Maintain a passive attitude and permit relaxation to occur at its own pace.  When distracting thoughts occur,  try to ignore them by not dwelling upon them  and return to repeating "one."  With practice, the response should come with little effort.  Practice the technique once or twice daily,  but not within two hours after any meal,  since the digestive processes seem to interfere with  the elicitation of the Relaxation Response. * It is better to use a soothing, mellifluous sound, preferably with no meaning. or association, to avoid stimulation of unnecessary thoughts - a mantra.

## 2015-12-04 NOTE — Assessment & Plan Note (Signed)
Check TSH and free T4; runs in the family

## 2015-12-04 NOTE — Progress Notes (Signed)
BP 112/64   Pulse 82   Temp 98.8 F (37.1 C)   Wt 142 lb (64.4 kg)   SpO2 97%   BMI 23.63 kg/m    Subjective:    Patient ID: Charlotte Thompson, female    DOB: 04/18/1965, 50 y.o.   MRN: [id number]  HPI: Charlotte Thompson is a 50 y.o. female  Chief Complaint  Patient presents with  . Medication Refill   Patient is here for follow-up, but starts the visit by saying "I'm a hot mess" She just cannot get it together she says She cannot get her mind going in the direction it used to go in She is very depressed She went up to 40 mg of citalopram, but she had no emotions, didn't feel anything Dropping to 30 mg did not help any better than the 20 mg She is interested in a different medicine or something to go with her citalopram She is going through menopause, hot flashes are getting really intense; can sit on couch and immediately, dog snuggles up and his body heat will set her off Averaging 5 or 5-1/2 hours of sleep a night even after taking two tylenol PMs at night She does not want to gain weight She does have lower back pain occasionally Her last thyroid test was abnormal, TSH 0.060 Drinking too much water Not doing any counseling  [facility] 12/04/2015 06/16/2015 10/18/2014  Decreased Interest 3 0 1  Down, Depressed, Hopeless 3 1 1   PHQ - 2 Score 6 1 2   Altered sleeping 3 - 3  Tired, decreased energy 3 - 1  Change in appetite 3 - 0  Feeling bad or failure about yourself  3 - 0  Trouble concentrating 3 - 1  Moving slowly or fidgety/restless 0 - 0  Suicidal thoughts 0 - 0  PHQ-9 Score 21 - 7  Difficult doing work/chores Extremely dIfficult - -   Relevant past medical, surgical, family and social history reviewed Past Medical History:  Diagnosis Date  . GERD (gastroesophageal reflux disease)   . Hemorrhoid   . Hyperlipidemia   . Pneumonia    Past Surgical History:  Procedure Laterality Date  . COLONOSCOPY    . PARTIAL HYSTERECTOMY  2010   Family  History  Problem Relation Age of Onset  . Clotting disorder Mother   . Thyroid disease Sister   MD note: sister had graves disease, also had breast cancer  Social History  Substance Use Topics  . Smoking status: Former Smoker    Years: 16.00    Quit date: 03/15/2012  . Smokeless tobacco: Not on file  . Alcohol use No   Interim medical history since last visit reviewed. Allergies and medications reviewed  Review of Systems Per HPI unless specifically indicated above     Objective:    BP 112/64   Pulse 82   Temp 98.8 F (37.1 C)   Wt 142 lb (64.4 kg)   SpO2 97%   BMI 23.63 kg/m   Wt Readings from Last 3 Encounters:  12/04/15 142 lb (64.4 kg)  07/14/15 140 lb (63.5 kg)  06/16/15 141 lb (64 kg)    Physical Exam  Constitutional: She appears well-developed and well-nourished.  Weight down 1 pound since last visit  Eyes: EOM are normal. No scleral icterus.  No proptosis  Neck: No JVD present. No thyromegaly present.  Cardiovascular: Normal rate and regular rhythm.   Murmur heard.  Systolic murmur is present with a grade  of 2/6  Pulmonary/Chest: Effort normal and breath sounds normal. She has no wheezes.  Neurological: She is alert. She displays no tremor. Gait normal.  No tics  Skin: Skin is warm and dry. She is not diaphoretic. No erythema. No pallor.  Psychiatric: She has a normal mood and affect. Her mood appears not anxious. Her affect is not blunt, not labile and not inappropriate. She is not hyperactive, not slowed and not withdrawn. She does not exhibit a depressed mood. She expresses no homicidal and no suicidal ideation.  Good eye contact with examiner She is attentive.    Results for orders placed or performed in visit on 12/04/15  TSH  Result Value Ref Range   TSH 0.33 (L) mIU/L  T4, free  Result Value Ref Range   Free T4 1.5 0.8 - 1.8 ng/dL      Assessment & Plan:   Problem List Items Addressed This Visit      Endocrine   Hypothyroidism (Chronic)      Check TSH and free T4; runs in the family      Relevant Orders   TSH (Completed)   T4, free (Completed)     Other   Tobacco abuse (Chronic)    She is not ready to quit; I am here to help if/when she wants to stop smoking      Hot flashes, menopausal (Chronic)    Will try SNRI; also check TSH to r/o hyperthyroidism      Anxiety and depression    Will check TSH to see where that stands, obviously; because she also has hot flashes, and current SSRI not helping, will switch to SNRI; I also recommended counseling and gave her list of providers, she can schedule with one of her choosing; also did brief relaxation response demonstration with her and she felt better with very short trial already; encoruaged her to do that daily; if feeling worse before next visit, call me; if needing immediate medical attention, call 911 or go to ER, especially as we change medicines; she agrees       Other Visit Diagnoses   None.      Follow up plan: Return in about 3 weeks (around 12/25/2015) for follow-up.  An after-visit summary was printed and given to the patient at check-out.  Please see the patient instructions which may contain other information and recommendations beyond what is mentioned above in the assessment and plan.  Meds ordered this encounter  Medications  . venlafaxine XR (EFFEXOR XR) 75 MG 24 hr capsule    Sig: One by mouth daily x one week, then two a day    Dispense:  53 capsule    Refill:  0    STOP citalopram  . traZODone (DESYREL) 50 MG tablet    Sig: Take 1 tablet (50 mg total) by mouth at bedtime as needed for sleep.    Dispense:  30 tablet    Refill:  3    Orders Placed This Encounter  Procedures  . TSH  . T4, free

## 2015-12-05 ENCOUNTER — Encounter: Payer: Self-pay | Admitting: Family Medicine

## 2015-12-05 ENCOUNTER — Other Ambulatory Visit: Payer: Self-pay | Admitting: Family Medicine

## 2015-12-05 DIAGNOSIS — F329 Major depressive disorder, single episode, unspecified: Secondary | ICD-10-CM | POA: Insufficient documentation

## 2015-12-05 DIAGNOSIS — F32A Depression, unspecified: Secondary | ICD-10-CM | POA: Insufficient documentation

## 2015-12-05 DIAGNOSIS — E038 Other specified hypothyroidism: Secondary | ICD-10-CM

## 2015-12-05 DIAGNOSIS — F419 Anxiety disorder, unspecified: Secondary | ICD-10-CM | POA: Insufficient documentation

## 2015-12-05 MED ORDER — LEVOTHYROXINE SODIUM 112 MCG PO TABS: 112.0000 ug | ORAL_TABLET | Freq: Every day | ORAL | 1 refills | Status: DC

## 2015-12-05 NOTE — Assessment & Plan Note (Signed)
Will check TSH to see where that stands, obviously; because she also has hot flashes, and current SSRI not helping, will switch to SNRI; I also recommended counseling and gave her list of providers, she can schedule with one of her choosing; also did brief relaxation response demonstration with her and she felt better with very short trial already; encoruaged her to do that daily; if feeling worse before next visit, call me; if needing immediate medical attention, call 911 or go to ER, especially as we change medicines; she agrees

## 2015-12-05 NOTE — Assessment & Plan Note (Signed)
She is not ready to quit; I am here to help if/when she wants to stop smoking

## 2015-12-05 NOTE — Assessment & Plan Note (Signed)
Change dose; recheck TSH in 6 weeks

## 2015-12-05 NOTE — Assessment & Plan Note (Signed)
Will try SNRI; also check TSH to r/o hyperthyroidism

## 2015-12-05 NOTE — Progress Notes (Signed)
Change dose, next TSH due around Nov 6th

## 2015-12-25 ENCOUNTER — Encounter: Payer: Self-pay | Admitting: Family Medicine

## 2015-12-25 ENCOUNTER — Ambulatory Visit (INDEPENDENT_AMBULATORY_CARE_PROVIDER_SITE_OTHER): Payer: PRIVATE HEALTH INSURANCE | Admitting: Family Medicine

## 2015-12-25 VITALS — BP 116/64 | HR 83 | Temp 98.5°F | Resp 16 | Ht 65.0 in | Wt 140.1 lb

## 2015-12-25 DIAGNOSIS — Z720 Tobacco use: Secondary | ICD-10-CM | POA: Diagnosis not present

## 2015-12-25 DIAGNOSIS — R809 Proteinuria, unspecified: Secondary | ICD-10-CM

## 2015-12-25 DIAGNOSIS — F32A Depression, unspecified: Secondary | ICD-10-CM

## 2015-12-25 DIAGNOSIS — E7800 Pure hypercholesterolemia, unspecified: Secondary | ICD-10-CM

## 2015-12-25 DIAGNOSIS — G4700 Insomnia, unspecified: Secondary | ICD-10-CM | POA: Diagnosis not present

## 2015-12-25 DIAGNOSIS — E038 Other specified hypothyroidism: Secondary | ICD-10-CM

## 2015-12-25 DIAGNOSIS — Z862 Personal history of diseases of the blood and blood-forming organs and certain disorders involving the immune mechanism: Secondary | ICD-10-CM

## 2015-12-25 DIAGNOSIS — F329 Major depressive disorder, single episode, unspecified: Secondary | ICD-10-CM

## 2015-12-25 DIAGNOSIS — F418 Other specified anxiety disorders: Secondary | ICD-10-CM | POA: Diagnosis not present

## 2015-12-25 DIAGNOSIS — F419 Anxiety disorder, unspecified: Principal | ICD-10-CM

## 2015-12-25 DIAGNOSIS — N951 Menopausal and female climacteric states: Secondary | ICD-10-CM

## 2015-12-25 MED ORDER — VENLAFAXINE HCL ER 150 MG PO CP24: 150.0000 mg | ORAL_CAPSULE | Freq: Every day | ORAL | 5 refills | Status: DC

## 2015-12-25 MED ORDER — TRAZODONE HCL 100 MG PO TABS: 100.0000 mg | ORAL_TABLET | Freq: Every evening | ORAL | 2 refills | Status: DC | PRN

## 2015-12-25 NOTE — Progress Notes (Signed)
BP 116/64 (BP Location: Left Arm, Patient Position: Sitting, Cuff Size: Normal)   Pulse 83   Temp 98.5 F (36.9 C) (Oral)   Resp 16   Ht 5\' 5"  (1.651 m)   Wt 140 lb 2 oz (63.6 kg)   SpO2 99%   BMI 23.32 kg/m    Subjective:    Patient ID: Charlotte Thompson, female    DOB: 05-03-1965, 50 y.o.   MRN: [id number]  HPI: Charlotte Thompson is a 50 y.o. female  Chief Complaint  Patient presents with  . Follow-up    3 month    She is here for follow-up after several medicine changes  She is bothered by insomnia; the trazodone doesn't have any effect; she is having to take two tylenol PM; only taking 50 mg of trazodone; not watching horrible tv things at night; no electronics; doing sleepy time tea; no late exercise; walks 10-13k steps at home  Hypothyroidism; not much energy, "that's for sure"; blood work came back abnormal last time; no loose stools or diarrhea; not feeling shaky or jittery; just really tired; no heart palpitations Lab Results  Component Value Date   TSH 0.33 (L) 12/04/2015   Mood is improved; she has to take the medicine at night; if she takes it during the day, then it's just "okay"; if she takes them at night, then much better; not having night time awakenings; also helping hot flashes  Would like to have fasting cholesterol panel checked; will come back for labs  Not flashes are better on the medicine  Depression screen [facility] 2/9 12/25/2015 12/04/2015 06/16/2015 10/18/2014  Decreased Interest 0 3 0 1  Down, Depressed, Hopeless - 3 1 1   PHQ - 2 Score 0 6 1 2   Altered sleeping - 3 - 3  Tired, decreased energy - 3 - 1  Change in appetite - 3 - 0  Feeling bad or failure about yourself  - 3 - 0  Trouble concentrating - 3 - 1  Moving slowly or fidgety/restless - 0 - 0  Suicidal thoughts - 0 - 0  PHQ-9 Score - 21 - 7  Difficult doing work/chores - Extremely dIfficult - -   Relevant past medical, surgical, family and social history reviewed Past Medical History:    Diagnosis Date  . GERD (gastroesophageal reflux disease)   . Hemorrhoid   . Hyperlipidemia   . Pneumonia    Past Surgical History:  Procedure Laterality Date  . COLONOSCOPY    . PARTIAL HYSTERECTOMY  2010   Family History  Problem Relation Age of Onset  . Clotting disorder Mother   . Thyroid disease Sister    Social History  Substance Use Topics  . Smoking status: Former Smoker    Years: 16.00    Types: Cigarettes    Start date: 03/24/2014  . Smokeless tobacco: Never Used  . Alcohol use No  MD note: patient actually still smokes; she is not interested in quitting  Interim medical history since last visit reviewed. Allergies and medications reviewed  Review of Systems Per HPI unless specifically indicated above     Objective:    BP 116/64 (BP Location: Left Arm, Patient Position: Sitting, Cuff Size: Normal)   Pulse 83   Temp 98.5 F (36.9 C) (Oral)   Resp 16   Ht 5\' 5"  (1.651 m)   Wt 140 lb 2 oz (63.6 kg)   SpO2 99%   BMI 23.32 kg/m   Wt Readings from Last 3  Encounters:  12/25/15 140 lb 2 oz (63.6 kg)  12/04/15 142 lb (64.4 kg)  07/14/15 140 lb (63.5 kg)    Physical Exam  Constitutional: She appears well-developed and well-nourished. No distress.  Eyes: EOM are normal. No scleral icterus.  Neck: No thyromegaly present.  Cardiovascular: Normal rate and regular rhythm.   Pulmonary/Chest: Effort normal and breath sounds normal.  Abdominal: She exhibits no distension.  Neurological: She is alert. She displays no tremor.  No tics  Skin: No pallor.  Psychiatric: She has a normal mood and affect. Her behavior is normal. Judgment and thought content normal.   Results for orders placed or performed in visit on 12/04/15  TSH  Result Value Ref Range   TSH 0.33 (L) mIU/L  T4, free  Result Value Ref Range   Free T4 1.5 0.8 - 1.8 ng/dL      Assessment & Plan:   Problem List Items Addressed This Visit      Endocrine   Hypothyroidism (Chronic)    Reviewed  recent thyroid labs with her; TSH due on November 6th or just after        Other   Tobacco abuse    She is not ready to quit smoking at this time; I am here to help if/when she is ready      Microalbuminuria    Positive in April; recheck with other labs in a few weeks      Relevant Orders   BASIC METABOLIC PANEL WITH GFR   Microalbumin / creatinine urine ratio   Insomnia    Will increase the trazodone; good sleep hygiene; her over-replaced thyroid may be influencing this as well; hope this will improve      Hypercholesterolemia    Patient wishes to return for fasting labs; can do these together with thyroid labs in November if she wishes      Relevant Orders   Lipid panel   Hx of iron deficiency anemia    Last CBC in April was normal      Hot flashes, menopausal (Chronic)    Improved with new medicine, SNRI; continue      Anxiety and depression - Primary    Improved with addition of the SNRI; I am also hopeful that as her TSH returns to normal, she'll feel less irritable and tired       Other Visit Diagnoses   None.      Follow up plan: No Follow-up on file.  An after-visit summary was printed and given to the patient at check-out.  Please see the patient instructions which may contain other information and recommendations beyond what is mentioned above in the assessment and plan.  Meds ordered this encounter  Medications  . traZODone (DESYREL) 100 MG tablet    Sig: Take 1 tablet (100 mg total) by mouth at bedtime as needed for sleep.    Dispense:  30 tablet    Refill:  2    Cancel the 50 mg refills  . venlafaxine XR (EFFEXOR XR) 150 MG 24 hr capsule    Sig: Take 1 capsule (150 mg total) by mouth daily with breakfast.    Dispense:  30 capsule    Refill:  5    Orders Placed This Encounter  Procedures  . BASIC METABOLIC PANEL WITH GFR  . Microalbumin / creatinine urine ratio  . Lipid panel

## 2015-12-25 NOTE — Patient Instructions (Signed)
Return on or just after November 6th for fasting labs to check your thyroid, cholesterol, and other labs for fatigue Try to limit saturated fats in your diet (bologna, hot dogs, barbeque, cheeseburgers, hamburgers, steak, bacon, sausage, cheese, etc.) and get more fresh fruits, vegetables, and whole grains  I do encourage you to quit smoking Call [phone] to sign up for smoking cessation classes You can call 1-800-QUIT-NOW to talk with a smoking cessation coach Increase the trazodone to 100 mg at bedtime Continue the venlafaxine (Effexor)

## 2015-12-27 DIAGNOSIS — R809 Proteinuria, unspecified: Secondary | ICD-10-CM | POA: Insufficient documentation

## 2015-12-27 DIAGNOSIS — G4700 Insomnia, unspecified: Secondary | ICD-10-CM | POA: Insufficient documentation

## 2015-12-27 NOTE — Assessment & Plan Note (Signed)
Positive in April; recheck with other labs in a few weeks

## 2015-12-27 NOTE — Assessment & Plan Note (Signed)
Reviewed recent thyroid labs with her; TSH due on November 6th or just after

## 2015-12-27 NOTE — Assessment & Plan Note (Deleted)
She is not interested in quitting right now

## 2015-12-27 NOTE — Assessment & Plan Note (Signed)
Patient wishes to return for fasting labs; can do these together with thyroid labs in November if she wishes

## 2015-12-27 NOTE — Assessment & Plan Note (Signed)
She is not ready to quit smoking at this time; I am here to help if/when she is ready 

## 2015-12-27 NOTE — Assessment & Plan Note (Addendum)
Improved with new medicine, SNRI; continue

## 2015-12-27 NOTE — Assessment & Plan Note (Signed)
Last CBC in April was normal

## 2015-12-27 NOTE — Assessment & Plan Note (Signed)
Improved with addition of the SNRI; I am also hopeful that as her TSH returns to normal, she'll feel less irritable and tired

## 2015-12-27 NOTE — Assessment & Plan Note (Signed)
Will increase the trazodone; good sleep hygiene; her over-replaced thyroid may be influencing this as well; hope this will improve

## 2016-01-21 ENCOUNTER — Other Ambulatory Visit: Payer: Self-pay

## 2016-01-21 ENCOUNTER — Other Ambulatory Visit: Payer: Self-pay | Admitting: Family Medicine

## 2016-01-21 DIAGNOSIS — E7800 Pure hypercholesterolemia, unspecified: Secondary | ICD-10-CM

## 2016-01-21 DIAGNOSIS — E038 Other specified hypothyroidism: Secondary | ICD-10-CM

## 2016-01-21 DIAGNOSIS — R809 Proteinuria, unspecified: Secondary | ICD-10-CM

## 2016-01-21 NOTE — Telephone Encounter (Signed)
Left detailed voicemail

## 2016-01-21 NOTE — Telephone Encounter (Signed)
Labs were due November 6th; please ask patient to come today or tomorrow for labs so we can see if dose is appropriate; I'll send a few pills, but we really need to check level

## 2016-01-24 ENCOUNTER — Other Ambulatory Visit: Payer: Self-pay | Admitting: Family Medicine

## 2016-01-26 NOTE — Telephone Encounter (Signed)
rx approved

## 2016-01-28 ENCOUNTER — Encounter: Payer: Self-pay | Admitting: Family Medicine

## 2016-01-28 ENCOUNTER — Ambulatory Visit (INDEPENDENT_AMBULATORY_CARE_PROVIDER_SITE_OTHER): Payer: PRIVATE HEALTH INSURANCE | Admitting: Family Medicine

## 2016-01-28 VITALS — BP 122/76 | HR 91 | Temp 98.9°F | Resp 16 | Ht 65.0 in | Wt 137.6 lb

## 2016-01-28 DIAGNOSIS — J209 Acute bronchitis, unspecified: Secondary | ICD-10-CM | POA: Diagnosis not present

## 2016-01-28 MED ORDER — AZITHROMYCIN 250 MG PO TABS: ORAL_TABLET | ORAL | 0 refills | Status: DC

## 2016-01-28 MED ORDER — PREDNISONE 10 MG (21) PO TBPK: 10.0000 mg | ORAL_TABLET | Freq: Every day | ORAL | 0 refills | Status: DC

## 2016-01-28 NOTE — Progress Notes (Signed)
Name: Charlotte EpleySusan L Thompson   MRN: [id number]    DOB: 03/28/1965   Date:01/28/2016       Progress Note  Subjective  Chief Complaint  Chief Complaint  Patient presents with  . Acute Visit    chest congestion   . Otalgia    bilateral   . Fever    100.1 two days ago  This patient is usually followed by Dr. Sherie DonLada, new to me  Cough  This is a new problem. The current episode started in the past 7 days. The problem has been gradually worsening. The cough is non-productive. Associated symptoms include a fever and shortness of breath (especially when she lies down). Pertinent negatives include no ear congestion, headaches, nasal congestion or sore throat. She has tried prescription cough suppressant for the symptoms. The treatment provided mild relief.   Seen at the urgent care on Monday and was diagnosed with acute bronchitis, started on prescription antitussives.  Past Medical History:  Diagnosis Date  . GERD (gastroesophageal reflux disease)   . Hemorrhoid   . Hyperlipidemia   . Pneumonia     Past Surgical History:  Procedure Laterality Date  . COLONOSCOPY    . PARTIAL HYSTERECTOMY  2010    Family History  Problem Relation Age of Onset  . Clotting disorder Mother   . Thyroid disease Sister     Social History   Social History  . Marital status: Married    Spouse name: N/A  . Number of children: N/A  . Years of education: N/A   Occupational History  . Not on file.   Social History Main Topics  . Smoking status: Former Smoker    Years: 16.00    Types: Cigarettes    Start date: 03/24/2014  . Smokeless tobacco: Never Used  . Alcohol use No  . Drug use: No  . Sexual activity: Not on file   Other Topics Concern  . Not on file   Social History Narrative  . No narrative on file     Current Outpatient Prescriptions:  .  AMITIZA 24 MCG capsule, TAKE 1 CAPSULE (24 MCG TOTAL) BY MOUTH 2 (TWO) TIMES DAILY WITH A MEAL., Disp: 180 capsule, Rfl: 1 .  levothyroxine  (SYNTHROID, LEVOTHROID) 112 MCG tablet, TAKE 1 TABLET (112 MCG TOTAL) BY MOUTH DAILY., Disp: 5 tablet, Rfl: 0 .  Multiple Vitamin (MULTIVITAMIN) capsule, Take 1 capsule by mouth daily., Disp: , Rfl:  .  traZODone (DESYREL) 100 MG tablet, Take 1 tablet (100 mg total) by mouth at bedtime as needed for sleep., Disp: 30 tablet, Rfl: 2 .  venlafaxine XR (EFFEXOR XR) 150 MG 24 hr capsule, Take 1 capsule (150 mg total) by mouth daily with breakfast., Disp: 30 capsule, Rfl: 5 .  Ginger, Zingiber officinalis, (GINGER ROOT) 550 MG CAPS, Take 550 mg by mouth daily., Disp: , Rfl:   Allergies  Allergen Reactions  . Codeine      Review of Systems  Constitutional: Positive for fever.  HENT: Negative for sore throat.   Respiratory: Positive for cough and shortness of breath (especially when she lies down).   Neurological: Negative for headaches.      Objective  Vitals:   01/28/16 1555  BP: 122/76  Pulse: 91  Resp: 16  Temp: 98.9 F (37.2 C)  TempSrc: Oral  SpO2: 97%  Weight: 137 lb 9.6 oz (62.4 kg)  Height: 5\' 5"  (1.651 m)    Physical Exam  Constitutional: She is well-developed, well-nourished, and in no  distress.  HENT:  Nose: Right sinus exhibits no maxillary sinus tenderness and no frontal sinus tenderness. Left sinus exhibits no maxillary sinus tenderness and no frontal sinus tenderness.  Mouth/Throat: No posterior oropharyngeal erythema.  Cardiovascular: Normal rate, regular rhythm and normal heart sounds.   No murmur heard. Pulmonary/Chest: Effort normal. No respiratory distress. She has wheezes in the right lower field and the left lower field. She has no rhonchi. She has no rales.  Nursing note and vitals reviewed.      Assessment & Plan  1. Acute bronchitis, unspecified organism We'll start on antimicrobial therapy along with prednisone, advised to continue take cough syrup prescribed at the urgent care. - azithromycin (ZITHROMAX) 250 MG tablet; 2 tabs po day 1, then 1  tab po q day x 4 days  Dispense: 6 tablet; Refill: 0 - predniSONE (STERAPRED UNI-PAK 21 TAB) 10 MG (21) TBPK tablet; Take 1 tablet (10 mg total) by mouth daily. 60 50 40 30 20 10  then STOP.  Dispense: 21 tablet; Refill: 0   Jawana Reagor Asad A. Faylene KurtzShah [facility] 01/28/2016 4:18 PM

## 2016-03-10 ENCOUNTER — Other Ambulatory Visit: Payer: Self-pay | Admitting: Family Medicine

## 2016-03-10 LAB — LIPID PANEL
Cholesterol: 225 mg/dL — ABNORMAL HIGH (ref <200)
HDL: 48 mg/dL — ABNORMAL LOW (ref >50)
LDL Cholesterol: 142 mg/dL — ABNORMAL HIGH (ref <100)
Total CHOL/HDL Ratio: 4.7 Ratio (ref <5.0)
Triglycerides: 176 mg/dL — ABNORMAL HIGH (ref <150)
VLDL: 35 mg/dL — ABNORMAL HIGH (ref <30)

## 2016-03-10 LAB — BASIC METABOLIC PANEL WITH GFR
BUN: 11 mg/dL (ref 7–25)
CO2: 27 mmol/L (ref 20–31)
Calcium: 9 mg/dL (ref 8.6–10.2)
Chloride: 108 mmol/L (ref 98–110)
Creat: 0.76 mg/dL (ref 0.50–1.10)
GFR, Est African American: >89 mL/min (ref >=60)
GFR, Est Non African American: >89 mL/min (ref >=60)
Glucose, Bld: 89 mg/dL (ref 65–99)
Potassium: 4.8 mmol/L (ref 3.5–5.3)
Sodium: 140 mmol/L (ref 135–146)

## 2016-03-10 LAB — TSH: TSH: 2.12 mIU/L

## 2016-03-11 ENCOUNTER — Other Ambulatory Visit: Payer: Self-pay | Admitting: Family Medicine

## 2016-03-11 LAB — MICROALBUMIN / CREATININE URINE RATIO
Creatinine, Urine: 198 mg/dL (ref 20–320)
Microalb Creat Ratio: 40 mcg/mg creat — ABNORMAL HIGH (ref <30)
Microalb, Ur: 7.9 mg/dL

## 2016-03-11 MED ORDER — LEVOTHYROXINE SODIUM 112 MCG PO TABS: 112.0000 ug | ORAL_TABLET | Freq: Every day | ORAL | 1 refills | Status: DC

## 2016-03-11 NOTE — Progress Notes (Signed)
Thyroid med sent]

## 2016-03-12 ENCOUNTER — Other Ambulatory Visit: Payer: Self-pay | Admitting: Family Medicine

## 2016-03-12 DIAGNOSIS — E7800 Pure hypercholesterolemia, unspecified: Secondary | ICD-10-CM

## 2016-03-12 DIAGNOSIS — Z5181 Encounter for therapeutic drug level monitoring: Secondary | ICD-10-CM

## 2016-03-12 MED ORDER — PRAVASTATIN SODIUM 20 MG PO TABS: 20.0000 mg | ORAL_TABLET | Freq: Every day | ORAL | 1 refills | Status: DC

## 2016-03-12 NOTE — Assessment & Plan Note (Signed)
Start statin; check lipids and sgpt in 6-8 weeks 

## 2016-03-12 NOTE — Progress Notes (Signed)
rx for statin sent Orders for lipids and sgpt entered, 6-8 weeks after starting med

## 2016-03-12 NOTE — Assessment & Plan Note (Signed)
Check sgpt in 6-8 weeks 

## 2016-03-21 ENCOUNTER — Other Ambulatory Visit: Payer: Self-pay | Admitting: Family Medicine

## 2016-03-21 NOTE — Telephone Encounter (Signed)
rx approved

## 2016-05-13 ENCOUNTER — Other Ambulatory Visit: Payer: Self-pay | Admitting: Family Medicine

## 2016-05-13 DIAGNOSIS — Z5181 Encounter for therapeutic drug level monitoring: Secondary | ICD-10-CM

## 2016-05-13 DIAGNOSIS — E7800 Pure hypercholesterolemia, unspecified: Secondary | ICD-10-CM

## 2016-05-13 NOTE — Telephone Encounter (Signed)
Patient notified

## 2016-05-13 NOTE — Telephone Encounter (Signed)
Please remind patient that we had hoped to get labs (fasting) in mid-February I'll send in limited amount, but hope she'll get those done so we'll know if her liver is okay and if it's the right dose; thank you Rx for thyroid med sent

## 2016-05-17 LAB — LIPID PANEL
Cholesterol: 197 mg/dL (ref <200)
HDL: 47 mg/dL — ABNORMAL LOW (ref >50)
LDL Cholesterol: 126 mg/dL — ABNORMAL HIGH (ref <100)
Total CHOL/HDL Ratio: 4.2 Ratio (ref <5.0)
Triglycerides: 118 mg/dL (ref <150)
VLDL: 24 mg/dL (ref <30)

## 2016-05-17 LAB — ALT: ALT: 14 U/L (ref 6–29)

## 2016-05-18 ENCOUNTER — Other Ambulatory Visit: Payer: Self-pay | Admitting: Family Medicine

## 2016-05-18 DIAGNOSIS — Z5181 Encounter for therapeutic drug level monitoring: Secondary | ICD-10-CM

## 2016-05-18 DIAGNOSIS — E7800 Pure hypercholesterolemia, unspecified: Secondary | ICD-10-CM

## 2016-05-18 MED ORDER — PRAVASTATIN SODIUM 40 MG PO TABS: 40.0000 mg | ORAL_TABLET | Freq: Every day | ORAL | 1 refills | Status: DC

## 2016-05-18 NOTE — Progress Notes (Signed)
Increase statin; check labs around April 17th

## 2016-05-25 ENCOUNTER — Ambulatory Visit (INDEPENDENT_AMBULATORY_CARE_PROVIDER_SITE_OTHER): Payer: PRIVATE HEALTH INSURANCE | Admitting: Family Medicine

## 2016-05-25 ENCOUNTER — Encounter: Payer: Self-pay | Admitting: Family Medicine

## 2016-05-25 DIAGNOSIS — J0100 Acute maxillary sinusitis, unspecified: Secondary | ICD-10-CM | POA: Diagnosis not present

## 2016-05-25 MED ORDER — AMOXICILLIN-POT CLAVULANATE 875-125 MG PO TABS: 1.0000 | ORAL_TABLET | Freq: Two times a day (BID) | ORAL | 0 refills | Status: DC

## 2016-05-25 NOTE — Progress Notes (Signed)
Name: Charlotte Thompson   MRN: [id number]    DOB: 02-11-1966   Date:05/25/2016       Progress Note  Subjective  Chief Complaint  Chief Complaint  Patient presents with  . URI    ear pain, facial pressure, lymph nodes feels swollen for 5 days    Sinusitis  This is a new problem. The current episode started in the past 7 days (3 days ago). There has been no fever. Associated symptoms include congestion (congested when she wakes up in the morning), neck pain, sinus pressure and a sore throat. Pertinent negatives include no chills or coughing. Past treatments include oral decongestants (Theraflu). The treatment provided no relief.     Past Medical History:  Diagnosis Date  . GERD (gastroesophageal reflux disease)   . Hemorrhoid   . Hyperlipidemia   . Pneumonia     Past Surgical History:  Procedure Laterality Date  . COLONOSCOPY    . PARTIAL HYSTERECTOMY  2010    Family History  Problem Relation Age of Onset  . Clotting disorder Mother   . Thyroid disease Sister     Social History   Social History  . Marital status: Married    Spouse name: N/A  . Number of children: N/A  . Years of education: N/A   Occupational History  . Not on file.   Social History Main Topics  . Smoking status: Former Smoker    Years: 16.00    Types: Cigarettes    Start date: 03/24/2014  . Smokeless tobacco: Never Used  . Alcohol use No  . Drug use: No  . Sexual activity: Not on file   Other Topics Concern  . Not on file   Social History Narrative  . No narrative on file     Current Outpatient Prescriptions:  .  AMITIZA 24 MCG capsule, TAKE 1 CAPSULE (24 MCG TOTAL) BY MOUTH 2 (TWO) TIMES DAILY WITH A MEAL., Disp: 180 capsule, Rfl: 1 .  levothyroxine (SYNTHROID, LEVOTHROID) 112 MCG tablet, TAKE 1 TABLET (112 MCG TOTAL) BY MOUTH DAILY., Disp: 30 tablet, Rfl: 3 .  Multiple Vitamin (MULTIVITAMIN) capsule, Take 1 capsule by mouth daily., Disp: , Rfl:  .  pravastatin (PRAVACHOL) 40 MG  tablet, Take 1 tablet (40 mg total) by mouth at bedtime., Disp: 30 tablet, Rfl: 1 .  traZODone (DESYREL) 100 MG tablet, TAKE 1 TABLET (100 MG TOTAL) BY MOUTH AT BEDTIME AS NEEDED FOR SLEEP., Disp: 30 tablet, Rfl: 5 .  venlafaxine XR (EFFEXOR XR) 150 MG 24 hr capsule, Take 1 capsule (150 mg total) by mouth daily with breakfast., Disp: 30 capsule, Rfl: 5  Allergies  Allergen Reactions  . Codeine      Review of Systems  Constitutional: Negative for chills and fever.  HENT: Positive for congestion (congested when she wakes up in the morning), sinus pressure and sore throat.   Respiratory: Negative for cough.   Cardiovascular: Negative for chest pain.  Musculoskeletal: Positive for neck pain.      Objective  Vitals:   05/25/16 1325  BP: 120/70  Pulse: 87  Resp: 16  Temp: 98.8 F (37.1 C)  TempSrc: Oral  SpO2: 95%  Weight: 135 lb 11.2 oz (61.6 kg)  Height: 5\' 5"  (1.651 m)    Physical Exam  Constitutional: She is well-developed, well-nourished, and in no distress.  HENT:  Right Ear: Tympanic membrane and ear canal normal. No drainage or swelling.  Left Ear: Tympanic membrane and ear canal normal. No drainage  or swelling.  Nose: Right sinus exhibits maxillary sinus tenderness and frontal sinus tenderness. Left sinus exhibits maxillary sinus tenderness and frontal sinus tenderness.  Mouth/Throat: Posterior oropharyngeal erythema present. No oropharyngeal exudate or posterior oropharyngeal edema.  Neck: Neck supple. No edema and no erythema present.  Cardiovascular: Normal rate, regular rhythm, S1 normal, S2 normal and normal heart sounds.   No murmur heard. Pulmonary/Chest: Effort normal. No respiratory distress. She has no decreased breath sounds. She has no wheezes. She has no rhonchi.  Nursing note and vitals reviewed.       Assessment & Plan  1. Acute non-recurrent maxillary sinusitis By history and exam, started on amoxicillin-clavulanate for treatment, advised  increased fluid intake. - amoxicillin-clavulanate (AUGMENTIN) 875-125 MG tablet; Take 1 tablet by mouth 2 (two) times daily.  Dispense: 20 tablet; Refill: 0   Deborah Dondero Asad A. Faylene KurtzShah [facility] 05/25/2016 1:40 PM

## 2016-06-20 ENCOUNTER — Other Ambulatory Visit: Payer: Self-pay | Admitting: Family Medicine

## 2016-06-29 ENCOUNTER — Encounter: Payer: Self-pay | Admitting: Family Medicine

## 2016-06-29 ENCOUNTER — Ambulatory Visit (INDEPENDENT_AMBULATORY_CARE_PROVIDER_SITE_OTHER): Payer: PRIVATE HEALTH INSURANCE | Admitting: Family Medicine

## 2016-06-29 VITALS — BP 118/64 | HR 88 | Temp 98.4°F | Resp 14 | Wt 137.6 lb

## 2016-06-29 DIAGNOSIS — E538 Deficiency of other specified B group vitamins: Secondary | ICD-10-CM

## 2016-06-29 DIAGNOSIS — Z862 Personal history of diseases of the blood and blood-forming organs and certain disorders involving the immune mechanism: Secondary | ICD-10-CM

## 2016-06-29 DIAGNOSIS — E559 Vitamin D deficiency, unspecified: Secondary | ICD-10-CM

## 2016-06-29 DIAGNOSIS — F419 Anxiety disorder, unspecified: Secondary | ICD-10-CM | POA: Diagnosis not present

## 2016-06-29 DIAGNOSIS — Z5181 Encounter for therapeutic drug level monitoring: Secondary | ICD-10-CM

## 2016-06-29 DIAGNOSIS — Z23 Encounter for immunization: Secondary | ICD-10-CM

## 2016-06-29 DIAGNOSIS — F329 Major depressive disorder, single episode, unspecified: Secondary | ICD-10-CM

## 2016-06-29 DIAGNOSIS — R5383 Other fatigue: Secondary | ICD-10-CM

## 2016-06-29 DIAGNOSIS — E038 Other specified hypothyroidism: Secondary | ICD-10-CM

## 2016-06-29 DIAGNOSIS — R4589 Other symptoms and signs involving emotional state: Secondary | ICD-10-CM | POA: Diagnosis not present

## 2016-06-29 DIAGNOSIS — F32A Depression, unspecified: Secondary | ICD-10-CM

## 2016-06-29 DIAGNOSIS — R4689 Other symptoms and signs involving appearance and behavior: Secondary | ICD-10-CM

## 2016-06-29 DIAGNOSIS — E7800 Pure hypercholesterolemia, unspecified: Secondary | ICD-10-CM | POA: Diagnosis not present

## 2016-06-29 HISTORY — DX: Other symptoms and signs involving appearance and behavior: R46.89

## 2016-06-29 LAB — COMPLETE METABOLIC PANEL WITH GFR
ALT: 12 U/L (ref 6–29)
AST: 13 U/L (ref 10–35)
Albumin: 3.8 g/dL (ref 3.6–5.1)
Alkaline Phosphatase: 62 U/L (ref 33–115)
BUN: 9 mg/dL (ref 7–25)
CO2: 26 mmol/L (ref 20–31)
Calcium: 9.5 mg/dL (ref 8.6–10.2)
Chloride: 105 mmol/L (ref 98–110)
Creat: 0.69 mg/dL (ref 0.50–1.10)
GFR, Est African American: >89 mL/min (ref >=60)
GFR, Est Non African American: >89 mL/min (ref >=60)
Glucose, Bld: 85 mg/dL (ref 65–99)
Potassium: 4.4 mmol/L (ref 3.5–5.3)
Sodium: 144 mmol/L (ref 135–146)
Total Bilirubin: 0.2 mg/dL (ref 0.2–1.2)
Total Protein: 5.9 g/dL — ABNORMAL LOW (ref 6.1–8.1)

## 2016-06-29 LAB — CBC WITH DIFFERENTIAL/PLATELET
Basophils Absolute: 0 cells/uL (ref 0–200)
Basophils Relative: 0 %
Eosinophils Absolute: 162 cells/uL (ref 15–500)
Eosinophils Relative: 2 %
HCT: 36.9 % (ref 35.0–45.0)
Hemoglobin: 12.5 g/dL (ref 11.7–15.5)
Lymphocytes Relative: 25 %
Lymphs Abs: 2025 cells/uL (ref 850–3900)
MCH: 33.2 pg — ABNORMAL HIGH (ref 27.0–33.0)
MCHC: 33.9 g/dL (ref 32.0–36.0)
MCV: 97.9 fL (ref 80.0–100.0)
MPV: 9 fL (ref 7.5–12.5)
Monocytes Absolute: 567 cells/uL (ref 200–950)
Monocytes Relative: 7 %
Neutro Abs: 5346 cells/uL (ref 1500–7800)
Neutrophils Relative %: 66 %
Platelets: 220 10*3/uL (ref 140–400)
RBC: 3.77 MIL/uL — ABNORMAL LOW (ref 3.80–5.10)
RDW: 13.1 % (ref 11.0–15.0)
WBC: 8.1 10*3/uL (ref 3.8–10.8)

## 2016-06-29 LAB — T4, FREE: Free T4: 1.4 ng/dL (ref 0.8–1.8)

## 2016-06-29 LAB — LIPID PANEL
Cholesterol: 152 mg/dL (ref <200)
HDL: 42 mg/dL — ABNORMAL LOW (ref >50)
LDL Cholesterol: 60 mg/dL (ref <100)
Total CHOL/HDL Ratio: 3.6 Ratio (ref <5.0)
Triglycerides: 249 mg/dL — ABNORMAL HIGH (ref <150)
VLDL: 50 mg/dL — ABNORMAL HIGH (ref <30)

## 2016-06-29 LAB — TSH: TSH: 0.68 mIU/L

## 2016-06-29 MED ORDER — BUSPIRONE HCL 5 MG PO TABS: 5.0000 mg | ORAL_TABLET | Freq: Two times a day (BID) | ORAL | 0 refills | Status: DC

## 2016-06-29 NOTE — Progress Notes (Signed)
BP 118/64   Pulse 88   Temp 98.4 F (36.9 C) (Oral)   Resp 14   Wt 137 lb 9.6 oz (62.4 kg)   SpO2 97%   BMI 22.90 kg/m    Subjective:    Patient ID: Charlotte Thompson, female    DOB: Nov 15, 1965, 51 y.o.   MRN: [id number]  HPI: Charlotte Thompson is a 51 y.o. female  Chief Complaint  Patient presents with  . Fatigue    Happen for the last a month.  Does not feel right. Feel really tired and not want to be around anyone.   . Depression    Happen for the last a month.  Does not feel right; she starts feeling down and not want to communicate with anyone    HPI Fatigue for one month; progressively worse Fatigue all through the day She wants to sleep, can't focus, like a lethargic cat that ate a big meal and wants to lay on the couch She could care less to be around people She gets off work and wants to do absolutely nothing Going on for one month She sleeps good for 2-3 hours and then wakes up for an hour, then back to sleep but not deep sleep, just dozing She cannot remember her last dream Feels sad She felt like this when her dad died; she has a lot on her plate, working full time and 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; youngest daughter doesn't drive No SI/HI She was really angry with upper management at work; I could have just killed them last week she says; when I immediately asked for clarification, she said she just wanted to punch them; she could have punched every one of their lights out; she has never had trouble with violence in the past; usually not feeling aggressive like this; whatever this is that has gotten on her has gotten crazy; she does not think she is capable of committing harm against anyone at her place of work; she does not possess any weapons; she would be willing to talk to a psychologist about her feelings voluntarily; her last day at [employer] is this Friday; she does not have any plans to go in and harm anyone; what's done is  done; she says after further discussion that it was a poor choice of words to use the word kill Hx of vitamin B12 deficiency and actually was on shots for a while Hx of vitamin D deficiency as well Taking multiple vitamin Hypothyroidism; has gained weight; sluggish bowels  Depression screen [facility] 2/9 06/29/2016 01/28/2016 12/25/2015 12/04/2015 06/16/2015  Decreased Interest 1 0 0 3 0  Down, Depressed, Hopeless 1 0 - 3 1  PHQ - 2 Score 2 0 0 6 1  Altered sleeping 1 - - 3 -  Tired, decreased energy 1 - - 3 -  Change in appetite 1 - - 3 -  Feeling bad or failure about yourself  1 - - 3 -  Trouble concentrating 1 - - 3 -  Moving slowly or fidgety/restless 1 - - 0 -  Suicidal thoughts 0 - - 0 -  PHQ-9 Score 8 - - 21 -  Difficult doing work/chores Somewhat difficult - - Extremely dIfficult -  MD note: patient denies SI, HI  Relevant past medical, surgical, family and social history reviewed Past Medical History:  Diagnosis Date  . GERD (gastroesophageal reflux disease)   . Hemorrhoid   . Hyperlipidemia   .  Pneumonia    Past Surgical History:  Procedure Laterality Date  . COLONOSCOPY    . PARTIAL HYSTERECTOMY  2010   Family History  Problem Relation Age of Onset  . Autoimmune disease Father   . Kidney failure Father   . Heart disease Father   . Heart attack Father   . Hypertension Father   . Hyperlipidemia Father   . Clotting disorder Mother   . Thyroid disease Sister   . Diabetes Paternal Grandmother   . Diabetes type II Paternal Grandmother    Social History  Substance Use Topics  . Smoking status: Current Every Day Smoker    Years: 16.00    Types: Cigarettes    Start date: 03/24/2014  . Smokeless tobacco: Never Used  . Alcohol use No   Interim medical history since last visit reviewed. Allergies and medications reviewed  Review of Systems Per HPI unless specifically indicated above     Objective:    BP 118/64   Pulse 88   Temp 98.4 F (36.9 C) (Oral)   Resp  14   Wt 137 lb 9.6 oz (62.4 kg)   SpO2 97%   BMI 22.90 kg/m   Wt Readings from Last 3 Encounters:  06/29/16 137 lb 9.6 oz (62.4 kg)  05/25/16 135 lb 11.2 oz (61.6 kg)  01/28/16 137 lb 9.6 oz (62.4 kg)    Physical Exam  Constitutional: She appears well-developed and well-nourished.  HENT:  Mouth/Throat: Mucous membranes are normal.  Eyes: EOM are normal. No scleral icterus.  Neck: No JVD present. No thyromegaly present.  Cardiovascular: Normal rate and regular rhythm.   Pulmonary/Chest: Effort normal and breath sounds normal.  Abdominal: She exhibits no distension.  Neurological: She is alert.  Skin: Skin is warm. There is erythema.  Psychiatric: She has a normal mood and affect. Her behavior is normal. Her mood appears not anxious. Her affect is not blunt. Her speech is not rapid and/or pressured. She is not hyperactive, not slowed and not withdrawn. Thought content is not delusional. Cognition and memory are not impaired. She does not exhibit a depressed mood. She expresses no homicidal and no suicidal ideation.      Assessment & Plan:   Problem List Items Addressed This Visit      Endocrine   Hypothyroidism (Chronic)    Check TSH and free T4 and adjust medicine if needed      Relevant Orders   T4, free (Completed)   TSH (Completed)     Other   Vitamin D deficiency    Check level      Relevant Orders   VITAMIN D 25 Hydroxy (Vit-D Deficiency, Fractures) (Completed)   Vitamin B12 deficiency    Check level and offer injections if indicated      Relevant Orders   B12 (Completed)   Medication monitoring encounter    Check labs      Relevant Orders   COMPLETE METABOLIC PANEL WITH GFR (Completed)   Hypercholesterolemia    Check labs; avoid saturated fats      Relevant Orders   Lipid panel (Completed)   Hx of iron deficiency anemia    Check CBC      Relevant Orders   CBC with Differential/Platelet (Completed)   Fatigue    Check labs; sounds like  depression playing a part; refer to psych      Anxiety and depression    Start buspirone; refer to psych; she contracted verbally for safety towards self and others  Relevant Orders   Ambulatory referral to Psychiatry   Aggressive behavior    Refer to psychiatrist; start medicine; no hx of bipolar or personality disorder      Relevant Orders   Ambulatory referral to Psychiatry    Other Visit Diagnoses    Need for diphtheria-tetanus-pertussis (Tdap) vaccine    -  Primary   Relevant Orders   Tdap vaccine greater than or equal to 7yo IM (Completed)       Follow up plan: Return in about 3 weeks (around 07/20/2016) for follow-up.  An after-visit summary was printed and given to the patient at check-out.  Please see the patient instructions which may contain other information and recommendations beyond what is mentioned above in the assessment and plan.  Meds ordered this encounter  Medications  . busPIRone (BUSPAR) 5 MG tablet    Sig: Take 1 tablet (5 mg total) by mouth 2 (two) times daily.    Dispense:  60 tablet    Refill:  0    Orders Placed This Encounter  Procedures  . Tdap vaccine greater than or equal to 7yo IM  . T4, free  . B12  . VITAMIN D 25 Hydroxy (Vit-D Deficiency, Fractures)  . COMPLETE METABOLIC PANEL WITH GFR  . Lipid panel  . TSH  . CBC with Differential/Platelet  . Ambulatory referral to Psychiatry

## 2016-06-29 NOTE — Assessment & Plan Note (Signed)
Check TSH and free T4 and adjust medicine if needed

## 2016-06-29 NOTE — Assessment & Plan Note (Signed)
Start buspirone; refer to psych; she contracted verbally for safety towards self and others

## 2016-06-29 NOTE — Patient Instructions (Signed)
We'll get labs today Start the new medicine for anxiety We'll have you see the behavioral health professional If you think you are a danger to yourself or others, please call 911 12 Ways to Curb Anxiety  ?Anxiety is normal human sensation. It is what helped our ancestors survive the pitfalls of the wilderness. Anxiety is defined as experiencing worry or nervousness about an imminent event or something with an uncertain outcome. It is a feeling experienced by most people at some point in their lives. Anxiety can be triggered by a very personal issue, such as the illness of a loved one, or an event of global proportions, such as a refugee crisis. Some of the symptoms of anxiety are:  Feeling restless.  Having a feeling of impending danger.  Increased heart rate.  Rapid breathing. Sweating.  Shaking.  Weakness or feeling tired.  Difficulty concentrating on anything except the current worry.  Insomnia.  Stomach or bowel problems. What can we do about anxiety we may be feeling? There are many techniques to help manage stress and relax. Here are 12 ways you can reduce your anxiety almost immediately: 1. Turn off the constant feed of information. Take a social media sabbatical. Studies have shown that social media directly contributes to social anxiety.  2. Monitor your television viewing habits. Are you watching shows that are also contributing to your anxiety, such as 24-hour news stations? Try watching something else, or better yet, nothing at all. Instead, listen to music, read an inspirational book or practice a hobby. 3. Eat nutritious meals. Also, don't skip meals and keep healthful snacks on hand. Hunger and poor diet contributes to feeling anxious. 4. Sleep. Sleeping on a regular schedule for at least seven to eight hours a night will do wonders for your outlook when you are awake. 5. Exercise. Regular exercise will help rid your body of that anxious energy and help you get more restful  sleep. 6. Try deep (diaphragmatic) breathing. Inhale slowly through your nose for five seconds and exhale through your mouth. 7. Practice acceptance and gratitude. When anxiety hits, accept that there are things out of your control that shouldn't be of immediate concern.  8. Seek out humor. When anxiety strikes, watch a funny video, read jokes or call a friend who makes you laugh. Laughter is healing for our bodies and releases endorphins that are calming. 9. Stay positive. Take the effort to replace negative thoughts with positive ones. Try to see a stressful situation in a positive light. Try to come up with solutions rather than dwelling on the problem. 10. Figure out what triggers your anxiety. Keep a journal and make note of anxious moments and the events surrounding them. This will help you identify triggers you can avoid or even eliminate. 11. Talk to someone. Let a trusted friend, family member or even trained professional know that you are feeling overwhelmed and anxious. Verbalize what you are feeling and why.  12. Volunteer. If your anxiety is triggered by a crisis on a large scale, become an advocate and work to resolve the problem that is causing you unease. Anxiety is often unwelcome and can become overwhelming. If not kept in check, it can become a disorder that could require medical treatment. However, if you take the time to care for yourself and avoid the triggers that make you anxious, you will be able to find moments of relaxation and clarity that make your life much more enjoyable.

## 2016-06-29 NOTE — Assessment & Plan Note (Signed)
Check CBC 

## 2016-06-29 NOTE — Assessment & Plan Note (Signed)
Refer to psychiatrist; start medicine; no hx of bipolar or personality disorder

## 2016-06-29 NOTE — Assessment & Plan Note (Signed)
Check labs; avoid saturated fats

## 2016-06-29 NOTE — Assessment & Plan Note (Signed)
Check labs 

## 2016-06-29 NOTE — Assessment & Plan Note (Signed)
Check level 

## 2016-06-29 NOTE — Assessment & Plan Note (Signed)
Check level and offer injections if indicated

## 2016-06-29 NOTE — Assessment & Plan Note (Signed)
Check labs; sounds like depression playing a part; refer to psych

## 2016-06-30 LAB — VITAMIN B12: Vitamin B-12: 442 pg/mL (ref 200–1100)

## 2016-06-30 LAB — VITAMIN D 25 HYDROXY (VIT D DEFICIENCY, FRACTURES): Vit D, 25-Hydroxy: 27 ng/mL — ABNORMAL LOW (ref 30–100)

## 2016-07-13 ENCOUNTER — Other Ambulatory Visit: Payer: Self-pay | Admitting: Family Medicine

## 2016-07-14 NOTE — Telephone Encounter (Signed)
April 2018 sgpt and lipids reviewed; Rx approved

## 2016-07-15 ENCOUNTER — Telehealth: Payer: Self-pay | Admitting: Family Medicine

## 2016-07-15 MED ORDER — BUSPIRONE HCL 5 MG PO TABS: 5.0000 mg | ORAL_TABLET | Freq: Two times a day (BID) | ORAL | 6 refills | Status: DC | PRN

## 2016-07-15 NOTE — Telephone Encounter (Signed)
I left message; glad to hear medicine is helping; will send refills now; will see her at appt; call if any issues before then

## 2016-07-15 NOTE — Telephone Encounter (Signed)
Pt had appointment for May but had to reschedule for July (which her new insurance will begin at the end of May) due to not having insurance. Stated that the new medication that you placed her on is doing wonders. Can only take one daily due to it making her sleepy. Please give a call [phone]

## 2016-07-20 ENCOUNTER — Ambulatory Visit: Payer: PRIVATE HEALTH INSURANCE | Admitting: Family Medicine

## 2016-07-20 ENCOUNTER — Other Ambulatory Visit: Payer: Self-pay | Admitting: Family Medicine

## 2016-08-12 ENCOUNTER — Encounter: Payer: Self-pay | Admitting: Family Medicine

## 2016-08-23 ENCOUNTER — Other Ambulatory Visit: Payer: Self-pay

## 2016-08-23 MED ORDER — BUSPIRONE HCL 5 MG PO TABS: 5.0000 mg | ORAL_TABLET | Freq: Two times a day (BID) | ORAL | 1 refills | Status: DC | PRN

## 2016-08-23 NOTE — Telephone Encounter (Signed)
Ins requesting 90 day 

## 2016-09-09 ENCOUNTER — Other Ambulatory Visit: Payer: Self-pay | Admitting: Family Medicine

## 2016-09-17 ENCOUNTER — Ambulatory Visit (INDEPENDENT_AMBULATORY_CARE_PROVIDER_SITE_OTHER): Payer: BLUE CROSS/BLUE SHIELD | Admitting: Family Medicine

## 2016-09-17 ENCOUNTER — Encounter: Payer: Self-pay | Admitting: Family Medicine

## 2016-09-17 VITALS — BP 116/68 | HR 91 | Temp 98.3°F | Resp 14 | Ht 65.0 in | Wt 130.4 lb

## 2016-09-17 DIAGNOSIS — F32A Depression, unspecified: Secondary | ICD-10-CM

## 2016-09-17 DIAGNOSIS — Z1231 Encounter for screening mammogram for malignant neoplasm of breast: Secondary | ICD-10-CM | POA: Diagnosis not present

## 2016-09-17 DIAGNOSIS — F329 Major depressive disorder, single episode, unspecified: Secondary | ICD-10-CM

## 2016-09-17 DIAGNOSIS — Z1239 Encounter for other screening for malignant neoplasm of breast: Secondary | ICD-10-CM

## 2016-09-17 DIAGNOSIS — G4700 Insomnia, unspecified: Secondary | ICD-10-CM | POA: Diagnosis not present

## 2016-09-17 DIAGNOSIS — E559 Vitamin D deficiency, unspecified: Secondary | ICD-10-CM

## 2016-09-17 DIAGNOSIS — F419 Anxiety disorder, unspecified: Secondary | ICD-10-CM | POA: Diagnosis not present

## 2016-09-17 DIAGNOSIS — F4381 Prolonged grief disorder: Secondary | ICD-10-CM

## 2016-09-17 DIAGNOSIS — F4329 Adjustment disorder with other symptoms: Secondary | ICD-10-CM | POA: Diagnosis not present

## 2016-09-17 MED ORDER — SUVOREXANT 10 MG PO TABS: 1.0000 | ORAL_TABLET | Freq: Every evening | ORAL | 1 refills | Status: DC | PRN

## 2016-09-17 MED ORDER — VENLAFAXINE HCL ER 75 MG PO CP24: 225.0000 mg | ORAL_CAPSULE | Freq: Every day | ORAL | 5 refills | Status: DC

## 2016-09-17 NOTE — Progress Notes (Signed)
BP 116/68   Pulse 91   Temp 98.3 F (36.8 C) (Oral)   Resp 14   Ht '5\' 5"'$  (1.651 m)   Wt 130 lb 6.4 oz (59.1 kg)   SpO2 95%   BMI 21.70 kg/m    Subjective:    Patient ID: Charlotte Thompson, female    DOB: 06/06/1965, 51 y.o.   MRN: [id number]  HPI: Charlotte Thompson is a 51 y.o. female  Chief Complaint  Patient presents with  . Follow-up  . Medication Refill    HPI Patient is here for follow-up, issues with depression Her father died last year She is struggling with getting back to her old happy self Can't get back to that happy go lucky person she used to be The medicine just seem to be working or doing what she expected The trazodone is not working; she takes 3 of the 500/25 tylenol PM with it to go to sleep Trouble falling and staying asleep Ambien did not work, tried before Does have restless legs and legs cramp really bad Sleeps 1 to 9 5 hours a night some times; really fragmented, most in the late night, early morning hours Father had sleep apnea, did not wear the machine CPAP Vitamin D was low last time Amitiza was > $400 so she has been using another stool softener with success  Depression screen [id number] 2/9 09/17/2016 06/29/2016 01/28/2016 12/25/2015 12/04/2015  Decreased Interest 0 1 0 0 3  Down, Depressed, Hopeless 1 1 0 - 3  PHQ - 2 Score 1 2 0 0 6  Altered sleeping - 1 - - 3  Tired, decreased energy - 1 - - 3  Change in appetite - 1 - - 3  Feeling bad or failure about yourself  - 1 - - 3  Trouble concentrating - 1 - - 3  Moving slowly or fidgety/restless - 1 - - 0  Suicidal thoughts - 0 - - 0  PHQ-9 Score - 8 - - 21  Difficult doing work/chores - Somewhat difficult - - Extremely dIfficult   Relevant past medical, surgical, family and social history reviewed Past Medical History:  Diagnosis Date  . GERD (gastroesophageal reflux disease)   . Hemorrhoid   . Hyperlipidemia   . Pneumonia    Past Surgical History:  Procedure Laterality Date  . COLONOSCOPY     . PARTIAL HYSTERECTOMY  2010   Family History  Problem Relation Age of Onset  . Autoimmune disease Father   . Kidney failure Father   . Heart disease Father   . Heart attack Father   . Hypertension Father   . Hyperlipidemia Father   . Clotting disorder Mother   . Kidney disease Mother   . Thyroid disease Sister   . Cancer Sister        breast  . Thyroid disease Daughter   . Diabetes Paternal Grandmother   . Diabetes type II Paternal Grandmother   . Hypertension Daughter   . Hearing loss Maternal Grandmother   . Cancer Maternal Grandmother        colon  . Cancer Maternal Grandfather        prostate  . Pneumonia Paternal Grandfather   . Cancer Paternal Grandfather        bone   Social History   Social History  . Marital status: Married    Spouse name: N/A  . Number of children: N/A  . Years of education: N/A   Occupational History  .  Not on file.   Social History Main Topics  . Smoking status: Current Every Day Smoker    Years: 16.00    Types: Cigarettes    Start date: 03/24/2014  . Smokeless tobacco: Never Used  . Alcohol use No  . Drug use: No  . Sexual activity: Not Currently   Other Topics Concern  . Not on file   Social History Narrative  . No narrative on file    Interim medical history since last visit reviewed. Allergies and medications reviewed  Review of Systems Per HPI unless specifically indicated above     Objective:    BP 116/68   Pulse 91   Temp 98.3 F (36.8 C) (Oral)   Resp 14   Ht '5\' 5"'$  (1.651 m)   Wt 130 lb 6.4 oz (59.1 kg)   SpO2 95%   BMI 21.70 kg/m   Wt Readings from Last 3 Encounters:  09/17/16 130 lb 6.4 oz (59.1 kg)  06/29/16 137 lb 9.6 oz (62.4 kg)  05/25/16 135 lb 11.2 oz (61.6 kg)    Physical Exam  Constitutional: She appears well-developed and well-nourished. No distress.  Eyes: No scleral icterus.  Neck: No thyromegaly present.  Cardiovascular: Normal rate, regular rhythm and normal heart sounds.   No  murmur heard. Pulmonary/Chest: Effort normal and breath sounds normal.  Abdominal: She exhibits no distension.  Musculoskeletal: Normal range of motion. She exhibits no edema.  Neurological: She is alert. She exhibits normal muscle tone.  Skin: Skin is warm and dry. She is not diaphoretic. No pallor.  Psychiatric: Her mood appears not anxious. Her affect is not blunt. She is not agitated, not hyperactive, not slowed and not withdrawn. She does not exhibit a depressed mood. She expresses no homicidal and no suicidal ideation.  Good eye contact with examiner   Results for orders placed or performed in visit on 06/29/16  T4, free  Result Value Ref Range   Free T4 1.4 0.8 - 1.8 ng/dL  B12  Result Value Ref Range   Vitamin B-12 442 200 - 1,100 pg/mL  VITAMIN D 25 Hydroxy (Vit-D Deficiency, Fractures)  Result Value Ref Range   Vit D, 25-Hydroxy 27 (L) 30 - 100 ng/mL  COMPLETE METABOLIC PANEL WITH GFR  Result Value Ref Range   Sodium 144 135 - 146 mmol/L   Potassium 4.4 3.5 - 5.3 mmol/L   Chloride 105 98 - 110 mmol/L   CO2 26 20 - 31 mmol/L   Glucose, Bld 85 65 - 99 mg/dL   BUN 9 7 - 25 mg/dL   Creat 0.69 0.50 - 1.10 mg/dL   Total Bilirubin 0.2 0.2 - 1.2 mg/dL   Alkaline Phosphatase 62 33 - 115 U/L   AST 13 10 - 35 U/L   ALT 12 6 - 29 U/L   Total Protein 5.9 (L) 6.1 - 8.1 g/dL   Albumin 3.8 3.6 - 5.1 g/dL   Calcium 9.5 8.6 - 10.2 mg/dL   GFR, Est African American >89 >=60 mL/min   GFR, Est Non African American >89 >=60 mL/min  Lipid panel  Result Value Ref Range   Cholesterol 152 <200 mg/dL   Triglycerides 249 (H) <150 mg/dL   HDL 42 (L) >50 mg/dL   Total CHOL/HDL Ratio 3.6 <5.0 Ratio   VLDL 50 (H) <30 mg/dL   LDL Cholesterol 60 <100 mg/dL  TSH  Result Value Ref Range   TSH 0.68 mIU/L  CBC with Differential/Platelet  Result Value Ref Range  WBC 8.1 3.8 - 10.8 K/uL   RBC 3.77 (L) 3.80 - 5.10 MIL/uL   Hemoglobin 12.5 11.7 - 15.5 g/dL   HCT 36.9 35.0 - 45.0 %   MCV 97.9  80.0 - 100.0 fL   MCH 33.2 (H) 27.0 - 33.0 pg   MCHC 33.9 32.0 - 36.0 g/dL   RDW 13.1 11.0 - 15.0 %   Platelets 220 140 - 400 K/uL   MPV 9.0 7.5 - 12.5 fL   Neutro Abs 5,346 1,500 - 7,800 cells/uL   Lymphs Abs 2,025 850 - 3,900 cells/uL   Monocytes Absolute 567 200 - 950 cells/uL   Eosinophils Absolute 162 15 - 500 cells/uL   Basophils Absolute 0 0 - 200 cells/uL   Neutrophils Relative % 66 %   Lymphocytes Relative 25 %   Monocytes Relative 7 %   Eosinophils Relative 2 %   Basophils Relative 0 %   Smear Review Criteria for review not met       Assessment & Plan:   Problem List Items Addressed This Visit      Other   Vitamin D deficiency    Reviewed previous instructions from lab results with her, see AVS; start supplementation      Prolonged grief reaction - Primary    Patient having difficulty getting over the death of her father; supportive listening provided; counseling, medicine options discussed; increase SNRI; reasons to seek immediate help reviewed      Insomnia    Chronic problem; failed multiple medicines; stop trazodone; do not use more than 50 mg of benadryl per night (diphenhydramine); refer to neurologist specializing in sleep      Relevant Orders   Ambulatory referral to Neurology   Anxiety and depression    Increase the SNRI; increase buspar; close f/u; counseling, hospice grief counseling       Other Visit Diagnoses    Screening for breast cancer       Relevant Orders   MM Digital Screening       Follow up plan: Return in about 3 weeks (around 10/08/2016) for follow-up visit with Dr. Sanda Klein, but call sooner if needed.  An after-visit summary was printed and given to the patient at [place].  Please see the patient instructions which may contain other information and recommendations beyond what is mentioned above in the assessment and plan.  Meds ordered this encounter  Medications  . venlafaxine XR (EFFEXOR XR) 75 MG 24 hr capsule    Sig: Take 3  capsules (225 mg total) by mouth daily with breakfast.    Dispense:  90 capsule    Refill:  5    Increasing the dose  . busPIRone (BUSPAR) 5 MG tablet    Sig: Take 2-3 tablets (10-15 mg total) by mouth 2 (two) times daily as needed.    Dispense:  180 tablet    Refill:  1  . Suvorexant (BELSOMRA) 10 MG TABS    Sig: Take 1 tablet by mouth at bedtime as needed.    Dispense:  30 tablet    Refill:  1    [facility]  . docusate sodium (COLACE) 50 MG capsule    Sig: Take 50 mg by mouth daily as needed for mild constipation.    Orders Placed This Encounter  Procedures  . MM Digital Screening  . Ambulatory referral to Neurology

## 2016-09-17 NOTE — Assessment & Plan Note (Signed)
Chronic problem; failed multiple medicines; stop trazodone; do not use more than 50 mg of benadryl per night (diphenhydramine); refer to neurologist specializing in sleep

## 2016-09-17 NOTE — Patient Instructions (Addendum)
Please do consider calling Hospice to take part in their grief counseling Phone: [phone] Phone: [phone]  Increase the venlafaxine to 225 mg once a day  Increase the buspirone from 5 mg twice a day to 10 mg twice a day Try that for one week and if not effective in dealing with anxiety, then increase to 15 mg twice a day  We'll refer you to have your sleep study and insomnia evaluated Do double check with the pharmacist about any questions you have about the Belsomra  Here were my instructions from your labs: "... her vitamin D is low; add 2,000 iu of vitamin D3 once a day to whatever else she might be taking; do that for 3-4 months, then drop back to 1,000 iu daily; her B12 is on the lower end of normal (normal should actually say [phone]); she can take extra B12 to give her more energy, 500 or 1000 mcg daily is plenty"

## 2016-09-20 ENCOUNTER — Other Ambulatory Visit: Payer: Self-pay | Admitting: Family Medicine

## 2016-09-23 DIAGNOSIS — F4329 Adjustment disorder with other symptoms: Secondary | ICD-10-CM

## 2016-09-23 DIAGNOSIS — F4381 Prolonged grief disorder: Secondary | ICD-10-CM

## 2016-09-23 HISTORY — DX: Adjustment disorder with other symptoms: F43.29

## 2016-09-23 HISTORY — DX: Prolonged grief disorder: F43.81

## 2016-09-23 NOTE — Assessment & Plan Note (Signed)
Reviewed previous instructions from lab results with her, see AVS; start supplementation

## 2016-09-23 NOTE — Assessment & Plan Note (Signed)
Increase the SNRI; increase buspar; close f/u; counseling, hospice grief counseling

## 2016-09-23 NOTE — Assessment & Plan Note (Signed)
Patient having difficulty getting over the death of her father; supportive listening provided; counseling, medicine options discussed; increase SNRI; reasons to seek immediate help reviewed

## 2016-09-28 ENCOUNTER — Ambulatory Visit
Admission: RE | Admit: 2016-09-28 | Discharge: 2016-09-28 | Disposition: A | Discharge status: Home / Self Care | Payer: BLUE CROSS/BLUE SHIELD | Source: Ambulatory Visit | Attending: Family Medicine | Admitting: Family Medicine

## 2016-09-28 DIAGNOSIS — Z1231 Encounter for screening mammogram for malignant neoplasm of breast: Secondary | ICD-10-CM | POA: Diagnosis not present

## 2016-09-28 DIAGNOSIS — Z1239 Encounter for other screening for malignant neoplasm of breast: Secondary | ICD-10-CM

## 2016-10-04 ENCOUNTER — Other Ambulatory Visit: Payer: Self-pay | Admitting: Family Medicine

## 2016-10-04 NOTE — Telephone Encounter (Signed)
rx approved, reviewed last spgt

## 2016-10-07 ENCOUNTER — Encounter: Payer: Self-pay | Admitting: Family Medicine

## 2016-10-07 ENCOUNTER — Ambulatory Visit (INDEPENDENT_AMBULATORY_CARE_PROVIDER_SITE_OTHER): Payer: BLUE CROSS/BLUE SHIELD | Admitting: Family Medicine

## 2016-10-07 DIAGNOSIS — F419 Anxiety disorder, unspecified: Secondary | ICD-10-CM | POA: Diagnosis not present

## 2016-10-07 DIAGNOSIS — F329 Major depressive disorder, single episode, unspecified: Secondary | ICD-10-CM | POA: Diagnosis not present

## 2016-10-07 DIAGNOSIS — F605 Obsessive-compulsive personality disorder: Secondary | ICD-10-CM | POA: Diagnosis not present

## 2016-10-07 DIAGNOSIS — G4700 Insomnia, unspecified: Secondary | ICD-10-CM

## 2016-10-07 DIAGNOSIS — R4689 Other symptoms and signs involving appearance and behavior: Secondary | ICD-10-CM

## 2016-10-07 DIAGNOSIS — F32A Depression, unspecified: Secondary | ICD-10-CM

## 2016-10-07 DIAGNOSIS — R4589 Other symptoms and signs involving emotional state: Secondary | ICD-10-CM | POA: Diagnosis not present

## 2016-10-07 MED ORDER — QUETIAPINE FUMARATE 50 MG PO TABS: 50.0000 mg | ORAL_TABLET | Freq: Every day | ORAL | 0 refills | Status: DC

## 2016-10-07 MED ORDER — LEVOTHYROXINE SODIUM 112 MCG PO TABS: 112.0000 ug | ORAL_TABLET | Freq: Every day | ORAL | 2 refills | Status: DC

## 2016-10-07 MED ORDER — BUSPIRONE HCL 15 MG PO TABS: 7.5000 mg | ORAL_TABLET | Freq: Two times a day (BID) | ORAL | 3 refills | Status: DC

## 2016-10-07 NOTE — Progress Notes (Signed)
BP 116/68   Pulse 98   Temp 98.1 F (36.7 C) (Oral)   Resp 16   Wt 130 lb 11.2 oz (59.3 kg)   SpO2 99%   BMI 21.75 kg/m    Subjective:    Patient ID: Charlotte Thompson, female    DOB: 1965-09-28, 51 y.o.   MRN: [id number]  HPI: Charlotte Thompson is a 51 y.o. female  Chief Complaint  Patient presents with  . Follow-up    3 week   . Medication Refill  . Medication Problem    Belsomra; pt states it's keeping her wide awake    HPI Patient is here for follow-up She was put on belsomra and states that it's keeping her wide awake She has been taking one pill a day; she slept 3 hours a day for 7 days; they have the "adverse effect on me" she says, kept her awake so she stopped after one week; stopped taking completely She was referred to sleep specialist but cannot get in until September, Dr. Manuella Ghazi  She was struggling with depression and her effexor was increased Working much better She has OCD as well; she has to dry her sinks after she does dishes, has to make her bed, it drives her insane if she can't do those things  She is taking buspirone and that helps the anxiety component, taking 10 mg now  TSH on labs in May was normal  Down to 1/2 ppd from 1 pdd, really trying Quit smoking for two years; started gaining weight, exercising at the gym with daughter, eating veggies, but gained weight, 25 pounds; 130 pounds to almost 160 pounds; she dieted, didn't matter; started back on cigarettes; frustrated  Depression screen [facility] 2/9 10/07/2016 09/17/2016 06/29/2016 01/28/2016 12/25/2015  Decreased Interest 1 0 1 0 0  Down, Depressed, Hopeless '2 1 1 '$ 0 -  PHQ - 2 Score '3 1 2 '$ 0 0  Altered sleeping 3 - 1 - -  Tired, decreased energy 2 - 1 - -  Change in appetite 1 - 1 - -  Feeling bad or failure about yourself  0 - 1 - -  Trouble concentrating 1 - 1 - -  Moving slowly or fidgety/restless 0 - 1 - -  Suicidal thoughts 0 - 0 - -  PHQ-9 Score 10 - 8 - -  Difficult doing work/chores  Somewhat difficult - Somewhat difficult - -    Relevant past medical, surgical, family and social history reviewed Past Medical History:  Diagnosis Date  . GERD (gastroesophageal reflux disease)   . Hemorrhoid   . Hyperlipidemia   . Pneumonia    Past Surgical History:  Procedure Laterality Date  . COLONOSCOPY    . PARTIAL HYSTERECTOMY  2010   Family History  Problem Relation Age of Onset  . Autoimmune disease Father   . Kidney failure Father   . Heart disease Father   . Heart attack Father   . Hypertension Father   . Hyperlipidemia Father   . Clotting disorder Mother   . Kidney disease Mother   . Thyroid disease Sister   . Cancer Sister        breast  . Breast cancer Sister 44  . Thyroid disease Daughter   . Diabetes Paternal Grandmother   . Diabetes type II Paternal Grandmother   . Hypertension Daughter   . Hearing loss Maternal Grandmother   . Cancer Maternal Grandmother        colon  . Cancer  Maternal Grandfather        prostate  . Pneumonia Paternal Grandfather   . Cancer Paternal Grandfather        bone   Social History   Social History  . Marital status: Married    Spouse name: N/A  . Number of children: N/A  . Years of education: N/A   Occupational History  . Not on file.   Social History Main Topics  . Smoking status: Current Every Day Smoker    Years: 16.00    Types: Cigarettes    Start date: 03/24/2014  . Smokeless tobacco: Never Used  . Alcohol use No  . Drug use: No  . Sexual activity: Not Currently   Other Topics Concern  . Not on file   Social History Narrative  . No narrative on file    Interim medical history since last visit reviewed. Allergies and medications reviewed  Review of Systems Per HPI unless specifically indicated above     Objective:    BP 116/68   Pulse 98   Temp 98.1 F (36.7 C) (Oral)   Resp 16   Wt 130 lb 11.2 oz (59.3 kg)   SpO2 99%   BMI 21.75 kg/m   Wt Readings from Last 3 Encounters:    10/07/16 130 lb 11.2 oz (59.3 kg)  09/17/16 130 lb 6.4 oz (59.1 kg)  06/29/16 137 lb 9.6 oz (62.4 kg)    Physical Exam  Constitutional: She appears well-developed and well-nourished. No distress.  Eyes: No scleral icterus.  Neck: No thyromegaly present.  Cardiovascular: Normal rate and regular rhythm.   Pulmonary/Chest: Effort normal and breath sounds normal.  Abdominal: She exhibits no distension.  Musculoskeletal: She exhibits no edema.  Neurological: She is alert. She exhibits normal muscle tone.  Skin: Skin is warm and dry. She is not diaphoretic. No pallor.  Psychiatric: Her mood appears not anxious. Her affect is not blunt. She is not agitated and not slowed. She does not exhibit a depressed mood.  Good eye contact with examiner    Results for orders placed or performed in visit on 06/29/16  T4, free  Result Value Ref Range   Free T4 1.4 0.8 - 1.8 ng/dL  B12  Result Value Ref Range   Vitamin B-12 442 200 - 1,100 pg/mL  VITAMIN D 25 Hydroxy (Vit-D Deficiency, Fractures)  Result Value Ref Range   Vit D, 25-Hydroxy 27 (L) 30 - 100 ng/mL  COMPLETE METABOLIC PANEL WITH GFR  Result Value Ref Range   Sodium 144 135 - 146 mmol/L   Potassium 4.4 3.5 - 5.3 mmol/L   Chloride 105 98 - 110 mmol/L   CO2 26 20 - 31 mmol/L   Glucose, Bld 85 65 - 99 mg/dL   BUN 9 7 - 25 mg/dL   Creat 0.69 0.50 - 1.10 mg/dL   Total Bilirubin 0.2 0.2 - 1.2 mg/dL   Alkaline Phosphatase 62 33 - 115 U/L   AST 13 10 - 35 U/L   ALT 12 6 - 29 U/L   Total Protein 5.9 (L) 6.1 - 8.1 g/dL   Albumin 3.8 3.6 - 5.1 g/dL   Calcium 9.5 8.6 - 10.2 mg/dL   GFR, Est African American >89 >=60 mL/min   GFR, Est Non African American >89 >=60 mL/min  Lipid panel  Result Value Ref Range   Cholesterol 152 <200 mg/dL   Triglycerides 249 (H) <150 mg/dL   HDL 42 (L) >50 mg/dL   Total CHOL/HDL  Ratio 3.6 <5.0 Ratio   VLDL 50 (H) <30 mg/dL   LDL Cholesterol 60 <100 mg/dL  TSH  Result Value Ref Range   TSH 0.68 mIU/L   CBC with Differential/Platelet  Result Value Ref Range   WBC 8.1 3.8 - 10.8 K/uL   RBC 3.77 (L) 3.80 - 5.10 MIL/uL   Hemoglobin 12.5 11.7 - 15.5 g/dL   HCT 36.9 35.0 - 45.0 %   MCV 97.9 80.0 - 100.0 fL   MCH 33.2 (H) 27.0 - 33.0 pg   MCHC 33.9 32.0 - 36.0 g/dL   RDW 13.1 11.0 - 15.0 %   Platelets 220 140 - 400 K/uL   MPV 9.0 7.5 - 12.5 fL   Neutro Abs 5,346 1,500 - 7,800 cells/uL   Lymphs Abs 2,025 850 - 3,900 cells/uL   Monocytes Absolute 567 200 - 950 cells/uL   Eosinophils Absolute 162 15 - 500 cells/uL   Basophils Absolute 0 0 - 200 cells/uL   Neutrophils Relative % 66 %   Lymphocytes Relative 25 %   Monocytes Relative 7 %   Eosinophils Relative 2 %   Basophils Relative 0 %   Smear Review Criteria for review not met       Assessment & Plan:   Problem List Items Addressed This Visit      Other   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Will add seroquel for assistance with mood and sleep and OCD tendencies; talked with patient about risk of cardiac events; so necessary to quit smoking; refer to psych      Relevant Orders   Ambulatory referral to Psychiatry   Insomnia    Chronic issue; has tried and failed multiple medicines; discussed option of seroquel which I think is needed at this point for very poor sleep, 3 hours a night for 7 nights      Relevant Orders   Ambulatory referral to Psychiatry   Anxiety and depression    Refer to psych      Relevant Orders   Ambulatory referral to Psychiatry   Aggressive behavior    Refer to psych again      Relevant Orders   Ambulatory referral to Psychiatry       Follow up plan: No Follow-up on file.  An after-visit summary was printed and given to the patient at [facility].  Please see the patient instructions which may contain other information and recommendations beyond what is mentioned above in the assessment and plan.  Meds ordered this encounter  Medications  . levothyroxine (SYNTHROID,  LEVOTHROID) 112 MCG tablet    Sig: Take 1 tablet (112 mcg total) by mouth daily.    Dispense:  90 tablet    Refill:  2  . busPIRone (BUSPAR) 15 MG tablet    Sig: Take 0.5-1 tablets (7.5-15 mg total) by mouth 2 (two) times daily. If needed    Dispense:  60 tablet    Refill:  3  . QUEtiapine (SEROQUEL) 50 MG tablet    Sig: Take 1 tablet (50 mg total) by mouth at bedtime.    Dispense:  30 tablet    Refill:  0    Orders Placed This Encounter  Procedures  . Ambulatory referral to Psychiatry

## 2016-10-07 NOTE — Assessment & Plan Note (Signed)
Refer to psych again 

## 2016-10-07 NOTE — Patient Instructions (Addendum)
I do encourage you to quit smoking Call [phone] to sign up for smoking cessation classes You can call 1-800-QUIT-NOW to talk with a smoking cessation coach  Start the seroquel at bedtime Call me in 5-7 days and we can increase then if needed We'll titrate up if needed Be aware of cardiac risk so it is so important to quit smoking  Health Risks of Smoking Smoking cigarettes is very bad for your health. Tobacco smoke has over 200 known poisons in it. It contains the poisonous gases nitrogen oxide and carbon monoxide. There are over 60 chemicals in tobacco smoke that cause cancer. Smoking is difficult to quit because a chemical in tobacco, called nicotine, causes addiction or dependence. When you smoke and inhale, nicotine is absorbed rapidly into the bloodstream through your lungs. Both inhaled and non-inhaled nicotine may be addictive. What are the risks of cigarette smoke? Cigarette smokers have an increased risk of many serious medical problems, including:  Lung cancer.  Lung disease, such as pneumonia, bronchitis, and emphysema.  Chest pain (angina) and heart attack because the heart is not getting enough oxygen.  Heart disease and peripheral blood vessel disease.  High blood pressure (hypertension).  Stroke.  Oral cancer, including cancer of the lip, mouth, or voice box.  Bladder cancer.  Pancreatic cancer.  Cervical cancer.  Pregnancy complications, including premature birth.  Stillbirths and smaller newborn babies, birth defects, and genetic damage to sperm.  Early menopause.  Lower estrogen level for women.  Infertility.  Facial wrinkles.  Blindness.  Increased risk of broken bones (fractures).  Senile dementia.  Stomach ulcers and internal bleeding.  Delayed wound healing and increased risk of complications during surgery.  Even smoking lightly shortens your life expectancy by several years.  Because of secondhand smoke exposure, children of  smokers have an increased risk of the following:  Sudden infant death syndrome (SIDS).  Respiratory infections.  Lung cancer.  Heart disease.  Ear infections.  What are the benefits of quitting? There are many health benefits of quitting smoking. Here are some of them:  Within days of quitting smoking, your risk of having a heart attack decreases, your blood flow improves, and your lung capacity improves. Blood pressure, pulse rate, and breathing patterns start returning to normal soon after quitting.  Within months, your lungs may clear up completely.  Quitting for 10 years reduces your risk of developing lung cancer and heart disease to almost that of a nonsmoker.  People who quit may see an improvement in their overall quality of life.  How do I quit smoking? Smoking is an addiction with both physical and psychological effects, and longtime habits can be hard to change. Your health care provider can recommend:  Programs and community resources, which may include group support, education, or talk therapy.  Prescription medicines to help reduce cravings.  Nicotine replacement products, such as patches, gum, and nasal sprays. Use these products only as directed. Do not replace cigarette smoking with electronic cigarettes, which are commonly called e-cigarettes. The safety of e-cigarettes is not known, and some may contain harmful chemicals.  A combination of two or more of these methods.  Where to find more information:  American Lung Association: www.lung.org  American Cancer Society: www.cancer.org Summary  Smoking cigarettes is very bad for your health. Cigarette smokers have an increased risk of many serious medical problems, including several cancers, heart disease, and stroke.  Smoking is an addiction with both physical and psychological effects, and longtime habits can be  hard to change.  By stopping right away, you can greatly reduce the risk of medical problems  for you and your family.  To help you quit smoking, your health care provider can recommend programs, community resources, prescription medicines, and nicotine replacement products such as patches, gum, and nasal sprays. This information is not intended to replace advice given to you by your health care provider. Make sure you discuss any questions you have with your health care provider. Document Released: 04/08/2004 Document Revised: 03/05/2016 Document Reviewed: 03/05/2016 Elsevier Interactive Patient Education  2017 [employer]

## 2016-10-07 NOTE — Assessment & Plan Note (Signed)
Will add seroquel for assistance with mood and sleep and OCD tendencies; talked with patient about risk of cardiac events; so necessary to quit smoking; refer to psych

## 2016-10-07 NOTE — Assessment & Plan Note (Signed)
Chronic issue; has tried and failed multiple medicines; discussed option of seroquel which I think is needed at this point for very poor sleep, 3 hours a night for 7 nights

## 2016-10-07 NOTE — Assessment & Plan Note (Signed)
Refer to psych 

## 2016-10-19 ENCOUNTER — Telehealth: Payer: Self-pay | Admitting: Family Medicine

## 2016-10-19 NOTE — Telephone Encounter (Signed)
Pt is asking that you please give her a call before you go to lunch. 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 She states that you had changed her medication and wanted her to contact you to let you know how it affects her.

## 2016-10-19 NOTE — Telephone Encounter (Signed)
I was not able to call before lunch; I had a meeting today I will let her sleep and try her at the end of the day or tomorrow

## 2016-10-20 MED ORDER — QUETIAPINE FUMARATE 100 MG PO TABS: 100.0000 mg | ORAL_TABLET | Freq: Every day | ORAL | 5 refills | Status: DC

## 2016-10-20 NOTE — Telephone Encounter (Signed)
I returned her call; she said I asked her to call; new medicine is working well; wishing to go up a little bit; wishes to go to 100 mg; refills sent, call with any problems

## 2016-10-21 ENCOUNTER — Telehealth: Payer: Self-pay | Admitting: Family Medicine

## 2016-10-21 DIAGNOSIS — R4689 Other symptoms and signs involving appearance and behavior: Secondary | ICD-10-CM

## 2016-10-21 DIAGNOSIS — F605 Obsessive-compulsive personality disorder: Secondary | ICD-10-CM

## 2016-10-21 DIAGNOSIS — F4329 Adjustment disorder with other symptoms: Secondary | ICD-10-CM

## 2016-10-21 DIAGNOSIS — F4381 Prolonged grief disorder: Secondary | ICD-10-CM

## 2016-10-21 NOTE — Telephone Encounter (Signed)
Thank you so much New referral placed

## 2016-10-21 NOTE — Telephone Encounter (Signed)
-----   Message from Clemmie KrillLatisha A [place], [place] sent at 10/21/2016  2:09 PM EDT ----- This patient was referred to psychiatry based upon the 10/07/16 note. The lady that sent this message is with [facility] with [facility].  B/C of the type of referral this is I can no longer see the order so another one has to be put in.  Please place new order and I will try to get her in with Dr. Caryn SectionAarti Kapur.  Thanks   ----- Message ----- From: Kerman PasseyLada, Ritta Hammes P, MD Sent: 10/21/2016  10:52 AM To: Clemmie KrillLatisha A Richmond, CMA  I don't know who this is, but if you can find her another provider for the same specialty, I'd appreciate it; thank you ----- Message ----- From: Clemmie Krillichmond, Latisha A, CMA Sent: 10/21/2016  10:38 AM To: Kerman PasseyMelinda P Isobella Ascher, MD    ----- Message ----- From: Ottis StainSlaugenhaupt, Terri L, CCC-SLP Sent: 10/21/2016   8:45 AM To: Clemmie KrillLatisha A Richmond, CMA  FYI:  We called your patient and she has declined to schedule an appointment with us, at this time.  She said it was too far out (Nov).  We appreciate the referral.

## 2016-10-22 NOTE — Telephone Encounter (Signed)
Referral has been sent.

## 2016-11-03 ENCOUNTER — Other Ambulatory Visit: Payer: Self-pay | Admitting: Family Medicine

## 2016-11-03 NOTE — Telephone Encounter (Signed)
I approved refills of seroquel on 10/20/16 Please contact pt and see how it's working; make sure she is taking 100 mg at bedtime

## 2016-11-03 NOTE — Telephone Encounter (Signed)
Patients states doing well and is on 100mg 

## 2016-12-30 ENCOUNTER — Other Ambulatory Visit: Payer: Self-pay | Admitting: Family Medicine

## 2016-12-30 NOTE — Telephone Encounter (Signed)
Labs done outpatient reviewed, Aug 05, 2016

## 2017-03-17 ENCOUNTER — Other Ambulatory Visit: Payer: Self-pay | Admitting: Family Medicine

## 2017-03-17 NOTE — Telephone Encounter (Signed)
Called pt to schedule an appt an no option to LVM

## 2017-03-17 NOTE — Telephone Encounter (Signed)
Please contact patient; ask her to schedule a follow-up visit with me in late January; we'll get labs then too Thank you

## 2017-03-29 ENCOUNTER — Other Ambulatory Visit: Payer: Self-pay | Admitting: Family Medicine

## 2017-03-29 NOTE — Telephone Encounter (Signed)
Last sgpt reviewed; Rx approved 

## 2017-04-16 ENCOUNTER — Other Ambulatory Visit: Payer: Self-pay | Admitting: Family Medicine

## 2017-04-18 ENCOUNTER — Telehealth: Payer: Self-pay

## 2017-04-18 NOTE — Telephone Encounter (Signed)
Copied from CRM [id number]. Topic: Quick Communication - See Telephone Encounter >> Apr 18, 2017  9:24 AM Marcos EkeGraves, Verlia Kaney, CMA wrote: CRM for notification. See Telephone encounter for:   04/18/17.  Left voicemail pt need to get Seroquel and venafalaxibne through psych. >> Apr 18, 2017 10:34 AM Windy KalataMichael, Taylor L, NT wrote: Patient is calling stating she does not have a psych. And Dr Sherie DonLada prescribes these. Pt is requesting a call back from [facility]   Pt notified needs an appt

## 2017-04-18 NOTE — Telephone Encounter (Signed)
These are psych medicines Per Aug 2018 notes, she was re-referred to psych She should be getting psych meds from just one provider Ask her to call her psychiatrist Thank you

## 2017-04-18 NOTE — Telephone Encounter (Signed)
Left detailed voicemial 

## 2017-04-22 ENCOUNTER — Other Ambulatory Visit: Payer: Self-pay

## 2017-04-22 MED ORDER — VENLAFAXINE HCL ER 75 MG PO CP24: 225.0000 mg | ORAL_CAPSULE | Freq: Every day | ORAL | 0 refills | Status: DC

## 2017-04-22 MED ORDER — QUETIAPINE FUMARATE 100 MG PO TABS: 100.0000 mg | ORAL_TABLET | Freq: Every day | ORAL | 0 refills | Status: DC

## 2017-04-22 NOTE — Telephone Encounter (Signed)
Copied from CRM [id number]. Topic: Quick Communication - See Telephone Encounter >> Apr 18, 2017  9:24 AM Marcos EkeGraves, Briena Swingler, CMA wrote: CRM for notification. See Telephone encounter for:   04/18/17.  Left voicemail pt need to get Seroquel and venafalaxibne through psych. >> Apr 18, 2017 10:34 AM Windy KalataMichael, Taylor L, NT wrote: Patient is calling stating she does not have a psych. And Dr Sherie DonLada prescribes these. [facility] >> Apr 22, 2017  9:21 AM Louie BunPalacios Medina, Rosey Batheresa D wrote: Patient called back today and said that she has an appt with Dr. Sherie DonLada next Friday but if she can please call her in enough venafalaxibne to last her until her appt with her. Please call patient back.

## 2017-04-22 NOTE — Telephone Encounter (Signed)
Prescriptions for venlafaxine and quetiapine sent

## 2017-04-29 ENCOUNTER — Ambulatory Visit: Payer: BLUE CROSS/BLUE SHIELD | Admitting: Family Medicine

## 2017-04-30 NOTE — Telephone Encounter (Signed)
Closing out old note 

## 2017-04-30 NOTE — Progress Notes (Signed)
Closing out scanned document note open since  May 2018 

## 2017-05-05 ENCOUNTER — Other Ambulatory Visit: Payer: Self-pay

## 2017-05-05 MED ORDER — LEVOTHYROXINE SODIUM 112 MCG PO TABS: 112.0000 ug | ORAL_TABLET | Freq: Every day | ORAL | 0 refills | Status: DC

## 2017-05-05 MED ORDER — PRAVASTATIN SODIUM 40 MG PO TABS: ORAL_TABLET | ORAL | 0 refills | Status: DC

## 2017-05-05 MED ORDER — VENLAFAXINE HCL ER 75 MG PO CP24: 225.0000 mg | ORAL_CAPSULE | Freq: Every day | ORAL | 0 refills | Status: DC

## 2017-05-05 NOTE — Telephone Encounter (Signed)
90 day supply

## 2017-05-05 NOTE — Telephone Encounter (Signed)
Appointment on 05/13/17

## 2017-05-12 ENCOUNTER — Other Ambulatory Visit: Payer: Self-pay | Admitting: Family Medicine

## 2017-05-12 NOTE — Telephone Encounter (Signed)
Patient has an appt tomorrow

## 2017-05-13 ENCOUNTER — Encounter: Payer: Self-pay | Admitting: Family Medicine

## 2017-05-13 ENCOUNTER — Ambulatory Visit (INDEPENDENT_AMBULATORY_CARE_PROVIDER_SITE_OTHER): Payer: 59 | Admitting: Family Medicine

## 2017-05-13 VITALS — BP 118/72 | HR 79 | Temp 98.3°F | Resp 16 | Wt 146.7 lb

## 2017-05-13 DIAGNOSIS — E7800 Pure hypercholesterolemia, unspecified: Secondary | ICD-10-CM

## 2017-05-13 DIAGNOSIS — E559 Vitamin D deficiency, unspecified: Secondary | ICD-10-CM | POA: Diagnosis not present

## 2017-05-13 DIAGNOSIS — F329 Major depressive disorder, single episode, unspecified: Secondary | ICD-10-CM

## 2017-05-13 DIAGNOSIS — E038 Other specified hypothyroidism: Secondary | ICD-10-CM

## 2017-05-13 DIAGNOSIS — F32A Depression, unspecified: Secondary | ICD-10-CM

## 2017-05-13 DIAGNOSIS — Z5181 Encounter for therapeutic drug level monitoring: Secondary | ICD-10-CM

## 2017-05-13 DIAGNOSIS — F419 Anxiety disorder, unspecified: Secondary | ICD-10-CM | POA: Diagnosis not present

## 2017-05-13 MED ORDER — VENLAFAXINE HCL ER 75 MG PO CP24: 225.0000 mg | ORAL_CAPSULE | Freq: Every day | ORAL | 11 refills | Status: DC

## 2017-05-13 MED ORDER — QUETIAPINE FUMARATE 100 MG PO TABS: 100.0000 mg | ORAL_TABLET | Freq: Every day | ORAL | 11 refills | Status: DC

## 2017-05-13 NOTE — Progress Notes (Signed)
BP 118/72   Pulse 79   Temp 98.3 F (36.8 C) (Oral)   Resp 16   Wt 146 lb 11.2 oz (66.5 kg)   SpO2 96%   BMI 24.41 kg/m    Subjective:    Patient ID: Charlotte Thompson, female    DOB: 1965/08/01, 52 y.o.   MRN: [id number]  HPI: Charlotte Thompson is a 52 y.o. female  Chief Complaint  Patient presents with  . Medication Refill    HPI Patient is here for medication follow-up Dealing with sick mother-in-law; sounded positive about the stress, she knows the Lord She says the conjunction of the medicines she is herself again "Yes, thank you Jesus!" she says She is feeling so much better; she says that I hit the nail on the head with the combination of the medicines She said it took about a month for her to get better; not depressed any more; she is back to herself "I'm happy today, this is going to be a good day" after one month of medicine  Hypothyroidism  Lab Results  Component Value Date   TSH 0.68 06/29/2016  thyroid medicine every morning with no other pills, just water  Patient brough there LDL down incredibly, LDL dropped from 142 to 126 to 60; no problems on the medicine; takes her medicine religiously; eating better; home-cooked meals  Mammograms UTD  Depression screen [facility] 2/9 05/13/2017 10/07/2016 09/17/2016 06/29/2016 01/28/2016  Decreased Interest 0 1 0 1 0  Down, Depressed, Hopeless 0 '2 1 1 '$ 0  PHQ - 2 Score 0 '3 1 2 '$ 0  Altered sleeping - 3 - 1 -  Tired, decreased energy - 2 - 1 -  Change in appetite - 1 - 1 -  Feeling bad or failure about yourself  - 0 - 1 -  Trouble concentrating - 1 - 1 -  Moving slowly or fidgety/restless - 0 - 1 -  Suicidal thoughts - 0 - 0 -  PHQ-9 Score - 10 - 8 -  Difficult doing work/chores - Somewhat difficult - Somewhat difficult -    Relevant past medical, surgical, family and social history reviewed Past Medical History:  Diagnosis Date  . GERD (gastroesophageal reflux disease)   . Hemorrhoid   . Hyperlipidemia   . Pneumonia      Past Surgical History:  Procedure Laterality Date  . COLONOSCOPY    . PARTIAL HYSTERECTOMY  2010   Family History  Problem Relation Age of Onset  . Autoimmune disease Father   . Kidney failure Father   . Heart disease Father   . Heart attack Father   . Hypertension Father   . Hyperlipidemia Father   . Clotting disorder Mother   . Kidney disease Mother   . Thyroid disease Sister   . Cancer Sister        breast  . Breast cancer Sister 51  . Thyroid disease Daughter   . Diabetes Paternal Grandmother   . Diabetes type II Paternal Grandmother   . Hypertension Daughter   . Hearing loss Maternal Grandmother   . Cancer Maternal Grandmother        colon  . Cancer Maternal Grandfather        prostate  . Pneumonia Paternal Grandfather   . Cancer Paternal Grandfather        bone   Social History   Tobacco Use  . Smoking status: Current Every Day Smoker    Years: 16.00    Types: Cigarettes  Start date: 03/24/2014  . Smokeless tobacco: Never Used  Substance Use Topics  . Alcohol use: No    Alcohol/week: 0.0 oz  . Drug use: No    Interim medical history since last visit reviewed. Allergies and medications reviewed  Review of Systems Per HPI unless specifically indicated above     Objective:    BP 118/72   Pulse 79   Temp 98.3 F (36.8 C) (Oral)   Resp 16   Wt 146 lb 11.2 oz (66.5 kg)   SpO2 96%   BMI 24.41 kg/m   Wt Readings from Last 3 Encounters:  05/13/17 146 lb 11.2 oz (66.5 kg)  10/07/16 130 lb 11.2 oz (59.3 kg)  09/17/16 130 lb 6.4 oz (59.1 kg)    Physical Exam  Constitutional: She appears well-developed and well-nourished. No distress.  Weight gain 16 pounds over the last 8+ months  Eyes: No scleral icterus.  Neck: No thyromegaly present.  Cardiovascular: Normal rate and regular rhythm.  Pulmonary/Chest: Effort normal and breath sounds normal.  Abdominal: She exhibits no distension.  Musculoskeletal: She exhibits no edema.  Neurological: She  is alert. She exhibits normal muscle tone.  Skin: Skin is warm and dry. She is not diaphoretic. No pallor.  Psychiatric: Her mood appears not anxious. Her affect is not blunt. She is not agitated and not slowed. She does not exhibit a depressed mood.  Good eye contact with examiner    Results for orders placed or performed in visit on 06/29/16  T4, free  Result Value Ref Range   Free T4 1.4 0.8 - 1.8 ng/dL  B12  Result Value Ref Range   Vitamin B-12 442 200 - 1,100 pg/mL  VITAMIN D 25 Hydroxy (Vit-D Deficiency, Fractures)  Result Value Ref Range   Vit D, 25-Hydroxy 27 (L) 30 - 100 ng/mL  COMPLETE METABOLIC PANEL WITH GFR  Result Value Ref Range   Sodium 144 135 - 146 mmol/L   Potassium 4.4 3.5 - 5.3 mmol/L   Chloride 105 98 - 110 mmol/L   CO2 26 20 - 31 mmol/L   Glucose, Bld 85 65 - 99 mg/dL   BUN 9 7 - 25 mg/dL   Creat 0.69 0.50 - 1.10 mg/dL   Total Bilirubin 0.2 0.2 - 1.2 mg/dL   Alkaline Phosphatase 62 33 - 115 U/L   AST 13 10 - 35 U/L   ALT 12 6 - 29 U/L   Total Protein 5.9 (L) 6.1 - 8.1 g/dL   Albumin 3.8 3.6 - 5.1 g/dL   Calcium 9.5 8.6 - 10.2 mg/dL   GFR, Est African American >89 >=60 mL/min   GFR, Est Non African American >89 >=60 mL/min  Lipid panel  Result Value Ref Range   Cholesterol 152 <200 mg/dL   Triglycerides 249 (H) <150 mg/dL   HDL 42 (L) >50 mg/dL   Total CHOL/HDL Ratio 3.6 <5.0 Ratio   VLDL 50 (H) <30 mg/dL   LDL Cholesterol 60 <100 mg/dL  TSH  Result Value Ref Range   TSH 0.68 mIU/L  CBC with Differential/Platelet  Result Value Ref Range   WBC 8.1 3.8 - 10.8 K/uL   RBC 3.77 (L) 3.80 - 5.10 MIL/uL   Hemoglobin 12.5 11.7 - 15.5 g/dL   HCT 36.9 35.0 - 45.0 %   MCV 97.9 80.0 - 100.0 fL   MCH 33.2 (H) 27.0 - 33.0 pg   MCHC 33.9 32.0 - 36.0 g/dL   RDW 13.1 11.0 - 15.0 %  Platelets 220 140 - 400 K/uL   MPV 9.0 7.5 - 12.5 fL   Neutro Abs 5,346 1,500 - 7,800 cells/uL   Lymphs Abs 2,025 850 - 3,900 cells/uL   Monocytes Absolute 567 200 - 950  cells/uL   Eosinophils Absolute 162 15 - 500 cells/uL   Basophils Absolute 0 0 - 200 cells/uL   Neutrophils Relative % 66 %   Lymphocytes Relative 25 %   Monocytes Relative 7 %   Eosinophils Relative 2 %   Basophils Relative 0 %   Smear Review Criteria for review not met       Assessment & Plan:   Problem List Items Addressed This Visit      Endocrine   Hypothyroidism (Chronic)    Patient has gained significant weight over the last 8+ months; next TSH due in April 2019      Relevant Orders   TSH     Other   Medication monitoring encounter - Primary   Relevant Orders   COMPLETE METABOLIC PANEL WITH GFR   Vitamin D deficiency    Check level in April      Relevant Orders   VITAMIN D 25 Hydroxy (Vit-D Deficiency, Fractures)   Hypercholesterolemia    Check lipids in April      Relevant Orders   Lipid panel   Anxiety and depression    Patient reports feeling so much better; glad that the current medication is helping; continue same      Relevant Medications   venlafaxine XR (EFFEXOR-XR) 75 MG 24 hr capsule       Follow up plan: Return in about 6 months (around 11/13/2017) for fasting labs only, follow-up visit with Dr. Sanda Klein.  An after-visit summary was printed and given to the patient at [facility].  Please see the patient instructions which may contain other information and recommendations beyond what is mentioned above in the assessment and plan.  Meds ordered this encounter  Medications  . venlafaxine XR (EFFEXOR-XR) 75 MG 24 hr capsule    Sig: Take 3 capsules (225 mg total) by mouth daily with breakfast.    Dispense:  90 capsule    Refill:  11  . QUEtiapine (SEROQUEL) 100 MG tablet    Sig: Take 1 tablet (100 mg total) by mouth at bedtime.    Dispense:  30 tablet    Refill:  11    Orders Placed This Encounter  Procedures  . COMPLETE METABOLIC PANEL WITH GFR  . Lipid panel  . TSH  . VITAMIN D 25 Hydroxy (Vit-D Deficiency, Fractures)

## 2017-05-19 ENCOUNTER — Other Ambulatory Visit: Payer: Self-pay | Admitting: Family Medicine

## 2017-05-19 NOTE — Telephone Encounter (Signed)
Request for effexor came in from [facility] was already sent to [facility] at patient's request Denied [facility] request

## 2017-05-20 ENCOUNTER — Other Ambulatory Visit: Payer: Self-pay | Admitting: Family Medicine

## 2017-05-20 NOTE — Assessment & Plan Note (Signed)
Patient reports feeling so much better; glad that the current medication is helping; continue same

## 2017-05-20 NOTE — Assessment & Plan Note (Signed)
Patient has gained significant weight over the last 8+ months; next TSH due in April 2019

## 2017-05-20 NOTE — Assessment & Plan Note (Signed)
Check lipids in April

## 2017-05-20 NOTE — Telephone Encounter (Signed)
Refills already sent to [facility] per patient request on 05/13/17 (seroquel) Local pharmacy Rx request denied

## 2017-05-20 NOTE — Assessment & Plan Note (Signed)
Check level in April

## 2017-06-16 ENCOUNTER — Telehealth: Payer: Self-pay

## 2017-06-16 NOTE — Telephone Encounter (Signed)
Copied from CRM [id number]. Topic: Inquiry >> Jun 15, 2017  4:25 PM Windy KalataMichael, Taylor L, NT wrote: Patient is calling and states that she thought she had a appt in April to see Dr. Sherie DonLada. She states she has lab orders, which she does. She would like to know when she is supposed to get those labs done. Her next appt is in Sept.    Called pt no answer. LM for pt informing her of the need to have labs done in April, anytime, no appt needed. CRM created.

## 2017-07-03 ENCOUNTER — Other Ambulatory Visit: Payer: Self-pay | Admitting: Family Medicine

## 2017-07-04 NOTE — Telephone Encounter (Signed)
A full month early; denied

## 2017-07-08 ENCOUNTER — Other Ambulatory Visit: Payer: Self-pay

## 2017-07-08 DIAGNOSIS — Z5181 Encounter for therapeutic drug level monitoring: Secondary | ICD-10-CM

## 2017-07-08 LAB — COMPLETE METABOLIC PANEL WITH GFR
AG Ratio: 2 (calc) (ref 1.0–2.5)
ALT: 13 U/L (ref 6–29)
AST: 14 U/L (ref 10–35)
Albumin: 4.2 g/dL (ref 3.6–5.1)
Alkaline phosphatase (APISO): 61 U/L (ref 33–130)
BUN: 13 mg/dL (ref 7–25)
CO2: 28 mmol/L (ref 20–32)
Calcium: 9.5 mg/dL (ref 8.6–10.4)
Chloride: 103 mmol/L (ref 98–110)
Creat: 0.69 mg/dL (ref 0.50–1.05)
GFR, Est African American: 118 mL/min/{1.73_m2} (ref >=60)
GFR, Est Non African American: 102 mL/min/{1.73_m2} (ref >=60)
Globulin: 2.1 g/dL (calc) (ref 1.9–3.7)
Glucose, Bld: 110 mg/dL — ABNORMAL HIGH (ref 65–99)
Potassium: 3.9 mmol/L (ref 3.5–5.3)
Sodium: 141 mmol/L (ref 135–146)
Total Bilirubin: 0.4 mg/dL (ref 0.2–1.2)
Total Protein: 6.3 g/dL (ref 6.1–8.1)

## 2017-07-08 LAB — LIPID PANEL
Cholesterol: 206 mg/dL — ABNORMAL HIGH (ref <200)
HDL: 53 mg/dL (ref >50)
LDL Cholesterol (Calc): 128 mg/dL (calc) — ABNORMAL HIGH
Non-HDL Cholesterol (Calc): 153 mg/dL (calc) — ABNORMAL HIGH (ref <130)
Total CHOL/HDL Ratio: 3.9 (calc) (ref <5.0)
Triglycerides: 137 mg/dL (ref <150)

## 2017-07-08 LAB — TSH: TSH: 2.89 mIU/L

## 2017-07-09 LAB — VITAMIN D 25 HYDROXY (VIT D DEFICIENCY, FRACTURES): Vit D, 25-Hydroxy: 26 ng/mL — ABNORMAL LOW (ref 30–100)

## 2017-07-11 ENCOUNTER — Other Ambulatory Visit: Payer: Self-pay | Admitting: Family Medicine

## 2017-07-12 MED ORDER — PRAVASTATIN SODIUM 80 MG PO TABS: 80.0000 mg | ORAL_TABLET | Freq: Every day | ORAL | 3 refills | Status: DC

## 2017-07-12 NOTE — Telephone Encounter (Signed)
Increase her statin from 40 mg to 80 mg

## 2017-08-10 ENCOUNTER — Other Ambulatory Visit: Payer: Self-pay | Admitting: Family Medicine

## 2017-08-10 NOTE — Telephone Encounter (Signed)
Lab Results  Component Value Date   TSH 2.89 07/08/2017   Rx approved

## 2017-11-18 ENCOUNTER — Ambulatory Visit: Payer: 59 | Admitting: Family Medicine

## 2017-12-06 ENCOUNTER — Ambulatory Visit (INDEPENDENT_AMBULATORY_CARE_PROVIDER_SITE_OTHER): Payer: Self-pay | Admitting: Family Medicine

## 2017-12-06 ENCOUNTER — Encounter: Payer: Self-pay | Admitting: Family Medicine

## 2017-12-06 VITALS — BP 116/64 | HR 93 | Temp 98.3°F | Ht 65.0 in | Wt 153.9 lb

## 2017-12-06 DIAGNOSIS — E038 Other specified hypothyroidism: Secondary | ICD-10-CM

## 2017-12-06 DIAGNOSIS — E559 Vitamin D deficiency, unspecified: Secondary | ICD-10-CM

## 2017-12-06 DIAGNOSIS — Z23 Encounter for immunization: Secondary | ICD-10-CM

## 2017-12-06 DIAGNOSIS — F331 Major depressive disorder, recurrent, moderate: Secondary | ICD-10-CM

## 2017-12-06 DIAGNOSIS — F339 Major depressive disorder, recurrent, unspecified: Secondary | ICD-10-CM | POA: Insufficient documentation

## 2017-12-06 LAB — TSH: TSH: 0.51 mIU/L

## 2017-12-06 MED ORDER — QUETIAPINE FUMARATE 50 MG PO TABS: 50.0000 mg | ORAL_TABLET | Freq: Every day | ORAL | 0 refills | Status: DC

## 2017-12-06 NOTE — Progress Notes (Signed)
BP 116/64   Pulse 93   Temp 98.3 F (36.8 C) (Oral)   Ht 5\' 5"  (1.651 m)   Wt 153 lb 14.4 oz (69.8 kg)   SpO2 99%   BMI 25.61 kg/m    Subjective:    Patient ID: Charlotte Thompson, female    DOB: 12/13/1965, 52 y.o.   MRN: [id number]  HPI: Charlotte Thompson is a 52 y.o. female  Chief Complaint  Patient presents with  . Follow-up  . Medication Refill    HPI Patient is here for f/u of depression Recurrent chronic major depressive disorder She lost her mother in June "Why can't I beat this depression?" she asks Her body gets used to medicine; she'll take something, it will work, then after a while, it quits working and she has to switch to something else She did great on the seroquel for a while She has so much on her plate though with her mother's death and estate; one sibling lives out of state Interested in increasing the seroquel  Hypothyroidism; reviewed her last TSH  High cholesterol; rarely eats fast food; on statin Eats a lot of cereal; eats soft breakfast bars, oatmeal  Overweight; weight gain  Vitamin D deficiency; taking supplement  Patient has no insurance so limiting labs was discussed today  [facility] 12/06/2017 05/13/2017 10/07/2016 09/17/2016 06/29/2016  Decreased Interest 1 0 1 0 1  Down, Depressed, Hopeless 3 0 2 1 1   PHQ - 2 Score 4 0 3 1 2   Altered sleeping 3 - 3 - 1  Tired, decreased energy 3 - 2 - 1  Change in appetite 3 - 1 - 1  Feeling bad or failure about yourself  3 - 0 - 1  Trouble concentrating 3 - 1 - 1  Moving slowly or fidgety/restless 0 - 0 - 1  Suicidal thoughts 0 - 0 - 0  PHQ-9 Score 19 - 10 - 8  Difficult doing work/chores Extremely dIfficult - Somewhat difficult - Somewhat difficult  MD note: patient would not answer my questions regarding SI/HI  Fall Risk  12/06/2017 05/13/2017 10/07/2016 10/07/2016 09/17/2016  Falls in the past year? No No No No No   Relevant past medical, surgical, family and social history  reviewed Past Medical History:  Diagnosis Date  . GERD (gastroesophageal reflux disease)   . Hemorrhoid   . Hyperlipidemia   . Pneumonia    Past Surgical History:  Procedure Laterality Date  . COLONOSCOPY    . PARTIAL HYSTERECTOMY  2010   Family History  Problem Relation Age of Onset  . Autoimmune disease Father   . Kidney failure Father   . Heart disease Father   . Heart attack Father   . Hypertension Father   . Hyperlipidemia Father   . Clotting disorder Mother   . Kidney disease Mother   . Heart attack Mother   . Thyroid disease Sister   . Cancer Sister        breast  . Breast cancer Sister [id number]  . Thyroid disease Daughter   . Diabetes Paternal Grandmother   . Diabetes type II Paternal Grandmother   . Hypertension Daughter   . Hearing loss Maternal Grandmother   . Cancer Maternal Grandmother        colon  . Cancer Maternal Grandfather        prostate  . Pneumonia Paternal Grandfather   . Cancer Paternal Grandfather        bone  Social History   Tobacco Use  . Smoking status: Current Every Day Smoker    Years: 16.00    Types: Cigarettes    Start date: 03/24/2014  . Smokeless tobacco: Never Used  Substance Use Topics  . Alcohol use: No    Alcohol/week: 0.0 standard drinks  . Drug use: No     [facility] from [facility]/06/2017 [facility]  AUDIT-C Score  0      Interim medical history since last visit reviewed. Allergies and medications reviewed  Review of Systems Per HPI unless specifically indicated above     Objective:    BP 116/64   Pulse 93   Temp 98.3 F (36.8 C) (Oral)   Ht 5\' 5"  (1.651 m)   Wt 153 lb 14.4 oz (69.8 kg)   SpO2 99%   BMI 25.61 kg/m   Wt Readings from Last 3 Encounters:  12/06/17 153 lb 14.4 oz (69.8 kg)  05/13/17 146 lb 11.2 oz (66.5 kg)  10/07/16 130 lb 11.2 oz (59.3 kg)    Physical Exam  Constitutional: She appears well-developed and well-nourished.  Weight gain noted  HENT:  Mouth/Throat:  Mucous membranes are normal.  Eyes: EOM are normal. No scleral icterus.  Cardiovascular: Normal rate and regular rhythm.  Pulmonary/Chest: Effort normal and breath sounds normal.  Psychiatric: Her behavior is normal. She exhibits a depressed mood (briefly tearful).  Good eye contact with examiner; wearing bright yellow shirt; good hygiene; did not appear despondent; sounded motivated to try medicine changes      Assessment & Plan:   Problem List Items Addressed This Visit      Endocrine   Hypothyroidism (Chronic)    Check TSH today, adjust medicine if indicated; associated weight gain and depression      Relevant Orders   TSH     Other   Vitamin D deficiency    Patient taking supplement; opted to not draw the lab because of cost      Major depressive disorder, recurrent (HCC)    Supportive listening; list of resources provided; encouraged her to seek help if any dark thoughts, SI, HI; will increase the seroquel at her request; continue the venlafaxine; because of her lack of health insurance, I gave her the number to our local free clinic where she can receive not only medical care but psychiatric care and labs for free; she seemed hopeful at this prospect; she should follow-up there or here in a few weeks, as I don't want lack of insurance to keep her from receiving monitoring and further dose adjustment if needed; she is encouraged to call me in a couple of weeks for further increase in Seroquel (to 200 mg nightly) if needed; patient agrees with plan       Other Visit Diagnoses    Need for influenza vaccination    -  Primary   Relevant Orders   Flu Vaccine QUAD 6+ mos PF IM (Fluarix Quad PF) (Completed)       Follow up plan: Return in about 4 weeks (around 01/03/2018) for follow-up here or [facility].  An after-visit summary was printed and given to the patient at check-out.  Please see the patient instructions which may contain other information and recommendations beyond  what is mentioned above in the assessment and plan.  Meds ordered this encounter  Medications  . QUEtiapine (SEROQUEL) 50 MG tablet    Sig: Take 1 tablet (50 mg total) by mouth at bedtime.  Dispense:  30 tablet    Refill:  0    Orders Placed This Encounter  Procedures  . Flu Vaccine QUAD 6+ mos PF IM (Fluarix Quad PF)  . TSH

## 2017-12-06 NOTE — Patient Instructions (Addendum)
Call me in two weeks with an update and let me know how the medicine adjustment is doing and if we need to increase the Seroquel further [facility] to see if you qualify Phone: [phone] I'm there on the 2nd Tuesday every month Try to limit saturated fats in your diet (bologna, hot dogs, barbeque, cheeseburgers, hamburgers, steak, bacon, sausage, cheese, etc.) and get more fresh fruits, vegetables, and whole grains  Here are some resources to help you if you feel you are in a mental health crisis:  National Suicide Prevention Lifeline - Call (224)707-63361-234 435 9252  for help - Website with more resources: ARanked.fihttps://suicidepreventionlifeline.org/  [facility] Mobile Crisis Program - Call 830-767-5065774 [phone] for help. - Mobile Crisis Program available 24 hours a day, 365 days a year. - Available for anyone of any age in [place].  [facility] - Address: [address], [place] - Telephone: [phone]  - Hours of Operation: Sunday - Saturday - 8:00 a.m. - 8:00 p.m. - Medicaid, Medicare (Government Issued Only), BCBS, and [employer] - Crisis Management, Outpatient Individual & Group Therapy, Psychiatrists on-site to provide medication management, In-Home Psychiatric Care, and Peer Support Care.  [facility] - Call [phone] for help. - Mobile Crisis Program available 24 hours a day, 365 days a year. - Available for anyone of any age in [place]

## 2017-12-06 NOTE — Assessment & Plan Note (Signed)
Patient taking supplement; opted to not draw the lab because of cost

## 2017-12-06 NOTE — Assessment & Plan Note (Signed)
Supportive listening; list of resources provided; encouraged her to seek help if any dark thoughts, SI, HI; will increase the seroquel at her request; continue the venlafaxine; because of her lack of health insurance, I gave her the number to our local free clinic where she can receive not only medical care but psychiatric care and labs for free; she seemed hopeful at this prospect; she should follow-up there or here in a few weeks, as I don't want lack of insurance to keep her from receiving monitoring and further dose adjustment if needed; she is encouraged to call me in a couple of weeks for further increase in Seroquel (to 200 mg nightly) if needed; patient agrees with plan

## 2017-12-06 NOTE — Assessment & Plan Note (Signed)
Check TSH today, adjust medicine if indicated; associated weight gain and depression

## 2017-12-13 ENCOUNTER — Encounter: Payer: Self-pay | Admitting: Family Medicine

## 2017-12-13 MED ORDER — QUETIAPINE FUMARATE 50 MG PO TABS: 25.0000 mg | ORAL_TABLET | Freq: Every day | ORAL | Status: DC

## 2017-12-29 ENCOUNTER — Other Ambulatory Visit: Payer: Self-pay | Admitting: Family Medicine

## 2017-12-29 NOTE — Telephone Encounter (Signed)
Reviewed her last MyChart response

## 2018-02-02 ENCOUNTER — Encounter: Payer: Self-pay | Admitting: Family Medicine

## 2018-02-03 ENCOUNTER — Other Ambulatory Visit: Payer: Self-pay

## 2018-02-03 ENCOUNTER — Emergency Department
Admission: EM | Admit: 2018-02-03 | Discharge: 2018-02-03 | Disposition: A | Discharge status: Home / Self Care | Payer: Self-pay | Attending: Emergency Medicine | Admitting: Emergency Medicine

## 2018-02-03 ENCOUNTER — Emergency Department: Payer: Self-pay

## 2018-02-03 ENCOUNTER — Encounter: Payer: Self-pay | Admitting: Emergency Medicine

## 2018-02-03 DIAGNOSIS — F17210 Nicotine dependence, cigarettes, uncomplicated: Secondary | ICD-10-CM | POA: Insufficient documentation

## 2018-02-03 DIAGNOSIS — J209 Acute bronchitis, unspecified: Secondary | ICD-10-CM | POA: Insufficient documentation

## 2018-02-03 DIAGNOSIS — Z79899 Other long term (current) drug therapy: Secondary | ICD-10-CM | POA: Insufficient documentation

## 2018-02-03 DIAGNOSIS — E039 Hypothyroidism, unspecified: Secondary | ICD-10-CM | POA: Insufficient documentation

## 2018-02-03 MED ORDER — AZITHROMYCIN 250 MG PO TABS: ORAL_TABLET | ORAL | 0 refills | Status: DC

## 2018-02-03 MED ORDER — METHYLPREDNISOLONE 4 MG PO TBPK: ORAL_TABLET | ORAL | 0 refills | Status: DC

## 2018-02-03 MED ORDER — HYDROCOD POLST-CPM POLST ER 10-8 MG/5ML PO SUER: 5.0000 mL | Freq: Two times a day (BID) | ORAL | 0 refills | Status: DC

## 2018-02-03 NOTE — ED Provider Notes (Signed)
[facility] Emergency Department Provider Note   ____________________________________________   First MD Initiated Contact with Patient 02/03/18 [phone]     (approximate)  I have reviewed the triage vital signs and the nursing notes.   HISTORY  Chief Complaint Cough    HPI Charlotte Thompson is a 52 y.o. female who presents to the ED from home with a chief complaint of cough and sore throat.  Patient reports cough productive of clear sputum and throat irritation.  Denies associated fever, chills, chest pain, abdominal pain, nausea, vomiting or diarrhea.  Denies recent travel or trauma.  History of pneumonia and is concerned she is developing it again.   Past Medical History:  Diagnosis Date  . GERD (gastroesophageal reflux disease)   . Hemorrhoid   . Hyperlipidemia   . Pneumonia     Patient Active Problem List   Diagnosis Date Noted  . Major depressive disorder, recurrent (HCC) 12/06/2017  . 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 Prolonged grief reaction 09/23/2016  . Aggressive behavior 06/29/2016  . Fatigue 06/29/2016  . Vitamin D deficiency 06/29/2016  . Vitamin B12 deficiency 06/29/2016  . Microalbuminuria 12/27/2015  . Insomnia 12/27/2015  . Anxiety and depression 12/05/2015  . Irritability 06/21/2015  . Hypothyroidism 06/16/2015  . Tobacco abuse 06/16/2015  . Hypercholesterolemia 06/16/2015  . Medication monitoring encounter 06/16/2015  . Family history of chronic renal disease 06/16/2015  . Hx of iron deficiency anemia 06/16/2015  . Heart murmur 06/16/2015  . Benign cyst of skin 10/18/2014  . Hot flashes, menopausal 09/02/2014    Past Surgical History:  Procedure Laterality Date  . COLONOSCOPY    . PARTIAL HYSTERECTOMY  2010    Prior to Admission medications   Medication Sig Start Date End Date Taking? Authorizing Provider  azithromycin (ZITHROMAX Z-PAK) 250 MG tablet 2 tablets day #1, then 1  tablet daily until finished 02/03/18   Irean Hong, MD  chlorpheniramine-HYDROcodone [facility] ER) 10-8 MG/5ML SUER Take 5 mLs by mouth 2 (two) times daily. 02/03/18   Irean Hong, MD  docusate sodium (COLACE) 50 MG capsule Take 50 mg by mouth daily as needed for mild constipation.    [provider]  levothyroxine (SYNTHROID, LEVOTHROID) 112 MCG tablet Take 1 tablet (112 mcg total) by mouth daily. 08/10/17   Kerman Passey, MD  methylPREDNISolone (MEDROL DOSEPAK) 4 MG TBPK tablet Take as directed 02/03/18   Irean Hong, MD  pravastatin (PRAVACHOL) 80 MG tablet Take 1 tablet (80 mg total) by mouth at bedtime. 07/12/17   Kerman Passey, MD  QUEtiapine (SEROQUEL) 100 MG tablet Take 1 tablet (100 mg total) by mouth at bedtime. 05/13/17   Kerman Passey, MD  QUEtiapine (SEROQUEL) 50 MG tablet Take 0.5 tablets (25 mg total) by mouth at bedtime. 12/13/17   Kerman Passey, MD  QUEtiapine (SEROQUEL) 50 MG tablet Take 0.5 tablets (25 mg total) by mouth at bedtime. 12/29/17   Kerman Passey, MD  venlafaxine XR (EFFEXOR-XR) 75 MG 24 hr capsule Take 3 capsules (225 mg total) by mouth daily with breakfast. 05/13/17   Lada, Janit Bern, MD    Allergies Aspirin and Codeine  Family History  Problem Relation Age of Onset  . Autoimmune disease Father   . Kidney failure Father   . Heart disease Father   . Heart attack Father   . Hypertension Father   . Hyperlipidemia Father   . Clotting disorder Mother   .  Kidney disease Mother   . Heart attack Mother   . Thyroid disease Sister   . Cancer Sister        breast  . Breast cancer Sister [id number]  . Thyroid disease Daughter   . Diabetes Paternal Grandmother   . Diabetes type II Paternal Grandmother   . Hypertension Daughter   . Hearing loss Maternal Grandmother   . Cancer Maternal Grandmother        colon  . Cancer Maternal Grandfather        prostate  . Pneumonia Paternal Grandfather   . Cancer Paternal Grandfather        bone     Social History Social History   Tobacco Use  . Smoking status: Current Every Day Smoker    Years: 16.00    Types: Cigarettes    Start date: 03/24/2014  . Smokeless tobacco: Never Used  Substance Use Topics  . Alcohol use: No    Alcohol/week: 0.0 standard drinks  . Drug use: No    Review of Systems  Constitutional: No fever/chills Eyes: No visual changes. ENT: No sore throat. Cardiovascular: Denies chest pain. Respiratory: Positive for productive cough.  Denies shortness of breath. Gastrointestinal: No abdominal pain.  No nausea, no vomiting.  No diarrhea.  No constipation. Genitourinary: Negative for dysuria. Musculoskeletal: Negative for back pain. Skin: Negative for rash. Neurological: Negative for headaches, focal weakness or numbness.   ____________________________________________   PHYSICAL EXAM:  VITAL SIGNS: ED Triage Vitals  Enc Vitals Group     BP 02/03/18 0246 (!) 167/99     Pulse Rate 02/03/18 0246 86     Resp 02/03/18 0246 18     Temp 02/03/18 0246 98.2 F (36.8 C)     Temp Source 02/03/18 0246 Oral     SpO2 02/03/18 0246 98 %     Weight 02/03/18 0240 153 lb (69.4 kg)     Height 02/03/18 0240 5\' 5"  (1.651 m)     Head Circumference --      Peak Flow --      Pain Score 02/03/18 0241 0     Pain Loc --      Pain Edu? --      Excl. in GC? --     Constitutional: Alert and oriented. Well appearing and in no acute distress. Eyes: Conjunctivae are normal. PERRL. EOMI. Head: Atraumatic. Nose: Congestion/rhinnorhea. Mouth/Throat: Mucous membranes are moist.  Oropharynx mildly erythematous without tonsillar swelling, exudates or peritonsillar abscess.  There is no hoarse or muffled voice.  There is no drooling. Neck: No stridor.  Supple neck without meningismus. Hematological/Lymphatic/Immunilogical: No cervical lymphadenopathy. Cardiovascular: Normal rate, regular rhythm. Grossly normal heart sounds.  Good peripheral circulation. Respiratory: Normal  respiratory effort.  No retractions. Lungs CTAB. Gastrointestinal: Soft and nontender. No distention. No abdominal bruits. No CVA tenderness. Musculoskeletal: No lower extremity tenderness nor edema.  No joint effusions. Neurologic:  Normal speech and language. No gross focal neurologic deficits are appreciated. No gait instability. Skin:  Skin is warm, dry and intact. No rash noted. Psychiatric: Mood and affect are normal. Speech and behavior are normal.  ____________________________________________   LABS (all labs ordered are listed, but only abnormal results are displayed)  Labs Reviewed - No data to display ____________________________________________  EKG  None ____________________________________________  RADIOLOGY  ED MD interpretation: No acute cardiopulmonary process  Official radiology report(s): Dg Chest 2 View  Result Date: 02/03/2018 CLINICAL DATA:  Nonproductive cough today. EXAM: CHEST - 2 VIEW COMPARISON:  None. FINDINGS: The heart size and mediastinal contours are within normal limits. Both lungs are clear. The visualized skeletal structures are unremarkable. IMPRESSION: No active cardiopulmonary disease. Electronically Signed   By: Burman Nieves M.D.   On: 02/03/2018 03:08    ____________________________________________   PROCEDURES  Procedure(s) performed: None  Procedures  Critical Care performed: No  ____________________________________________   INITIAL IMPRESSION / ASSESSMENT AND PLAN / ED COURSE  As part of my medical decision making, I reviewed the following data within the electronic MEDICAL RECORD NUMBER Nursing notes reviewed and incorporated, Radiograph reviewed and Notes from prior ED visits   52 year old female who presents with bronchitis.  History of pneumonia and concerned she is developing pneumonia.  Will treat with Z-Pak, Medrol Dosepak and Tussionex as needed.  She will follow-up closely with her PCP next week.  Strict return  precautions given.  Patient verbalizes understanding and agrees with plan of care.      ____________________________________________   FINAL CLINICAL IMPRESSION(S) / ED DIAGNOSES  Final diagnoses:  Acute bronchitis, unspecified organism     ED Discharge Orders         Ordered    methylPREDNISolone (MEDROL DOSEPAK) 4 MG TBPK tablet     02/03/18 0610    chlorpheniramine-HYDROcodone (TUSSIONEX PENNKINETIC ER) 10-8 MG/5ML SUER  2 times daily     02/03/18 0610    azithromycin (ZITHROMAX Z-PAK) 250 MG tablet     02/03/18 1610           Note:  This document was prepared using Dragon voice recognition software and may include unintentional dictation errors.    Irean Hong, MD 02/03/18 [phone]

## 2018-02-03 NOTE — Discharge Instructions (Signed)
1.  Take antibiotic as prescribed (Z-Pak). 2.  Take steroid taper as prescribed (Medrol Dosepak). 3.  You may take cough medicine as needed (Tussionex). 4.  Return to the ER for worsening symptoms, persistent vomiting, difficulty breathing or other concerns.

## 2018-02-03 NOTE — ED Triage Notes (Signed)
Patient ambulatory to triage with steady gait, without difficulty or distress noted; pt reports since yesterday having nonprod cough

## 2018-04-13 ENCOUNTER — Encounter: Payer: Self-pay | Admitting: Family Medicine

## 2018-04-17 ENCOUNTER — Other Ambulatory Visit: Payer: Self-pay | Admitting: Family Medicine

## 2018-04-19 ENCOUNTER — Encounter: Payer: Self-pay | Admitting: Family Medicine

## 2018-04-20 MED ORDER — VENLAFAXINE HCL ER 75 MG PO CP24: 225.0000 mg | ORAL_CAPSULE | Freq: Every day | ORAL | 5 refills | Status: DC

## 2018-04-20 MED ORDER — QUETIAPINE FUMARATE 50 MG PO TABS: 25.0000 mg | ORAL_TABLET | Freq: Every day | ORAL | 5 refills | Status: DC

## 2018-06-05 ENCOUNTER — Telehealth: Payer: Self-pay | Admitting: Family Medicine

## 2018-06-05 ENCOUNTER — Encounter: Payer: Self-pay | Admitting: Family Medicine

## 2018-06-05 NOTE — Telephone Encounter (Signed)
Please call patient and allow her to do telehealth/webex visit tomorrow when I have an opening. Thanks!

## 2018-06-06 ENCOUNTER — Telehealth (INDEPENDENT_AMBULATORY_CARE_PROVIDER_SITE_OTHER): Payer: HRSA Program | Admitting: Family Medicine

## 2018-06-06 ENCOUNTER — Other Ambulatory Visit: Payer: Self-pay

## 2018-06-06 DIAGNOSIS — R509 Fever, unspecified: Secondary | ICD-10-CM

## 2018-06-06 DIAGNOSIS — R0602 Shortness of breath: Secondary | ICD-10-CM | POA: Diagnosis not present

## 2018-06-06 DIAGNOSIS — Z20828 Contact with and (suspected) exposure to other viral communicable diseases: Secondary | ICD-10-CM | POA: Diagnosis not present

## 2018-06-06 DIAGNOSIS — J989 Respiratory disorder, unspecified: Secondary | ICD-10-CM

## 2018-06-06 MED ORDER — ALBUTEROL SULFATE HFA 108 (90 BASE) MCG/ACT IN AERS: 2.0000 | INHALATION_SPRAY | Freq: Four times a day (QID) | RESPIRATORY_TRACT | 2 refills | Status: DC | PRN

## 2018-06-06 MED ORDER — PREDNISONE 10 MG PO TABS: ORAL_TABLET | ORAL | 0 refills | Status: DC

## 2018-06-06 NOTE — Progress Notes (Addendum)
Virtual Visit via Telephone Note  I connected with Charlotte Thompson on 06/06/18 at 10:20 AM EDT by telephone and verified that I am speaking with the correct person using two identifiers.  She is at her home, and I spoke with her via telephone from [facility].   I discussed the limitations, risks, security and privacy concerns of performing an evaluation and management service by telephone and the availability of in person appointments. I also discussed with the patient that there may be a patient responsible charge related to this service. The patient expressed understanding and agreed to proceed.  History of Present Illness:  Pt presents with concern for respiratory illness. Started out Friday (06/02/18)/Saturday with sore throat and nasal congestion, Saturday/Sunday felt really horrible - very fatigued, joint pain/body aches, dry cough, some shortness of breath with mild to moderate activity.  No known COVID-19 contact in the last 2 weeks.  Last weekend she traveled to [place] to visit an Aunt, but no other travel. Temp at home has been around 99.4-99.4F orally.  Does have history of pneumonia 1998 and was hospitalized at that time.  Is a smoker.  No officially diagnosis of asthma or COPD.  She has not noticed any wheezing.   Observations/Objective:  Dry cough present during discussion.  Speaking in complete sentences and is pleasant throughout phone interview.  Assessment and Plan:  1. Respiratory illness - Continue to take Tussin DM OTC.  - albuterol (PROVENTIL HFA;VENTOLIN HFA) 108 (90 Base) MCG/ACT inhaler; Inhale 2 puffs into the lungs every 6 (six) hours as needed for wheezing or shortness of breath.  Dispense: 1 Inhaler; Refill: 2 - predniSONE (DELTASONE) 10 MG tablet; Take 6 tablets (60 mg total) by mouth daily with breakfast for 1 day, THEN 5 tablets (50 mg total) daily with breakfast for 1 day, THEN 4 tablets (40 mg total) daily with breakfast for 1 day, THEN 3  tablets (30 mg total) daily with breakfast for 1 day, THEN 2 tablets (20 mg total) daily with breakfast for 1 day, THEN 1 tablet (10 mg total) daily with breakfast for 1 day.  Dispense: 21 tablet; Refill: 0  2. Shortness of breat - albuterol (PROVENTIL HFA;VENTOLIN HFA) 108 (90 Base) MCG/ACT inhaler; Inhale 2 puffs into the lungs every 6 (six) hours as needed for wheezing or shortness of breath.  Dispense: 1 Inhaler; Refill: 2  Did advise this is possibly COVID-19 (due to recent travel), influenza, or flu-like illness.  She is a current smoker, and is experiencing shortness of breath.  Research is presently limited and mixed on the use of steroids for treatment of COVID19 related shortness of breath.  I am unable to test for this at this time, and therefore will treat the patient based on her respiratory needs and likelihood of benefit of prednisone for shortness of breath if this is related to smoking/COPD exacerbation.  ER precautions are discussed at length.  Patient requests work note and asks that I call her employer to notify of time off recommendation.  Follow Up Instructions:     Person Under Monitoring Name: Charlotte Thompson  Location: [address] [place] [address]   Infection Prevention Recommendations for Individuals Confirmed to have, or Being Evaluated for, 2019 Novel Coronavirus (COVID-19) Infection Who Receive Care at Home  Individuals who are confirmed to have, or are being evaluated for, COVID-19 should follow the prevention steps below until a healthcare provider or local or state health department says they  can return to normal activities.  Stay home except to get medical care You should restrict activities outside your home, except for getting medical care. Do not go to work, school, or public areas, and do not use public transportation or taxis.  Call ahead before visiting your doctor Before your medical appointment, call the healthcare provider and tell  them that you have, or are being evaluated for, COVID-19 infection. This will help the healthcare provider's office take steps to keep other people from getting infected. Ask your healthcare provider to call the local or state health department.  Monitor your symptoms Seek prompt medical attention if your illness is worsening (e.g., difficulty breathing). Before going to your medical appointment, call the healthcare provider and tell them that you have, or are being evaluated for, COVID-19 infection. Ask your healthcare provider to call the local or state health department.  Wear a facemask You should wear a facemask that covers your nose and mouth when you are in the same room with other people and when you visit a healthcare provider. People who live with or visit you should also wear a facemask while they are in the same room with you.  Separate yourself from other people in your home As much as possible, you should stay in a different room from other people in your home. Also, you should use a separate bathroom, if available.  Avoid sharing household items You should not share dishes, drinking glasses, cups, eating utensils, towels, bedding, or other items with other people in your home. After using these items, you should wash them thoroughly with soap and water.  Cover your coughs and sneezes Cover your mouth and nose with a tissue when you cough or sneeze, or you can cough or sneeze into your sleeve. Throw used tissues in a lined trash can, and immediately wash your hands with soap and water for at least 20 seconds or use an alcohol-based hand rub.  Wash your [employer] your hands often and thoroughly with soap and water for at least 20 seconds. You can use an alcohol-based hand sanitizer if soap and water are not available and if your hands are not visibly dirty. Avoid touching your eyes, nose, and mouth with unwashed hands.   Prevention Steps for Caregivers and Household  Members of Individuals Confirmed to have, or Being Evaluated for, COVID-19 Infection Being Cared for in the Home  If you live with, or provide care at home for, a person confirmed to have, or being evaluated for, COVID-19 infection please follow these guidelines to prevent infection:  Follow healthcare provider's instructions Make sure that you understand and can help the patient follow any healthcare provider instructions for all care.  Provide for the patient's basic needs You should help the patient with basic needs in the home and provide support for getting groceries, prescriptions, and other personal needs.  Monitor the patient's symptoms If they are getting sicker, call his or her medical provider and tell them that the patient has, or is being evaluated for, COVID-19 infection. This will help the healthcare provider's office take steps to keep other people from getting infected. Ask the healthcare provider to call the local or state health department.  Limit the number of people who have contact with the patient  If possible, have only one caregiver for the patient.  Other household members should stay in another home or place of residence. If this is not possible, they should stay  in another room, or be separated  from the patient as much as possible. Use a separate bathroom, if available.  Restrict visitors who do not have an essential need to be in the home.  Keep older adults, very young children, and other sick people away from the patient Keep older adults, very young children, and those who have compromised immune systems or chronic health conditions away from the patient. This includes people with chronic heart, lung, or kidney conditions, diabetes, and cancer.  Ensure good ventilation Make sure that shared spaces in the home have good air flow, such as from an air conditioner or an opened window, weather permitting.  Wash your hands often  Wash your hands often  and thoroughly with soap and water for at least 20 seconds. You can use an alcohol based hand sanitizer if soap and water are not available and if your hands are not visibly dirty.  Avoid touching your eyes, nose, and mouth with unwashed hands.  Use disposable paper towels to dry your hands. If not available, use dedicated cloth towels and replace them when they become wet.  Wear a facemask and gloves  Wear a disposable facemask at all times in the room and gloves when you touch or have contact with the patient's blood, body fluids, and/or secretions or excretions, such as sweat, saliva, sputum, nasal mucus, vomit, urine, or feces.  Ensure the mask fits over your nose and mouth tightly, and do not touch it during use.  Throw out disposable facemasks and gloves after using them. Do not reuse.  Wash your hands immediately after removing your facemask and gloves.  If your personal clothing becomes contaminated, carefully remove clothing and launder. Wash your hands after handling contaminated clothing.  Place all used disposable facemasks, gloves, and other waste in a lined container before disposing them with other household waste.  Remove gloves and wash your hands immediately after handling these items.  Do not share dishes, glasses, or other household items with the patient  Avoid sharing household items. You should not share dishes, drinking glasses, cups, eating utensils, towels, bedding, or other items with a patient who is confirmed to have, or being evaluated for, COVID-19 infection.  After the person uses these items, you should wash them thoroughly with soap and water.  Wash laundry thoroughly  Immediately remove and wash clothes or bedding that have blood, body fluids, and/or secretions or excretions, such as sweat, saliva, sputum, nasal mucus, vomit, urine, or feces, on them.  Wear gloves when handling laundry from the patient.  Read and follow directions on labels of  laundry or clothing items and detergent. In general, wash and dry with the warmest temperatures recommended on the label.  Clean all areas the individual has used often  Clean all touchable surfaces, such as counters, tabletops, doorknobs, bathroom fixtures, toilets, phones, keyboards, tablets, and bedside tables, every day. Also, clean any surfaces that may have blood, body fluids, and/or secretions or excretions on them.  Wear gloves when cleaning surfaces the patient has come in contact with.  Use a diluted bleach solution (e.g., dilute bleach with 1 part bleach and 10 parts water) or a household disinfectant with a label that says EPA-registered for coronaviruses. To make a bleach solution at home, add 1 tablespoon of bleach to 1 quart (4 cups) of water. For a larger supply, add  cup of bleach to 1 gallon (16 cups) of water.  Read labels of cleaning products and follow recommendations provided on product labels. Labels contain instructions for safe and  effective use of the cleaning product including precautions you should take when applying the product, such as wearing gloves or eye protection and making sure you have good ventilation during use of the product.  Remove gloves and wash hands immediately after cleaning.  Monitor yourself for signs and symptoms of illness Caregivers and household members are considered close contacts, should monitor their health, and will be asked to limit movement outside of the home to the extent possible. Follow the monitoring steps for close contacts listed on the symptom monitoring form.  ? If you have additional questions, contact your local health department or call the epidemiologist on call at [phone] (available 24/7). ? This guidance is subject to change. For the most up-to-date guidance from CDC, please refer to their website: TripMetro.hu  Strongly recommend quarantine, will keep  patient out of work this week, and will touch base if not improving on Monday for follow up telehealth visit.  Will reach out to her work per her request to email or fax work note.   I discussed the assessment and treatment plan with the patient. The patient was provided an opportunity to ask questions and all were answered. The patient agreed with the plan and demonstrated an understanding of the instructions.  The patient was advised to call back or seek an in-person evaluation if the symptoms worsen or if the condition fails to improve as anticipated.  I provided 22 minutes of non-face-to-face time during this encounter.  Doren Custard, FNP

## 2018-06-06 NOTE — Telephone Encounter (Signed)
scheduled

## 2018-06-06 NOTE — Telephone Encounter (Signed)
Pt has been scheduled for a telemedicine visit with Maurice Small on 06/06/2018 @ 10:20am

## 2018-06-09 ENCOUNTER — Ambulatory Visit (INDEPENDENT_AMBULATORY_CARE_PROVIDER_SITE_OTHER): Payer: Self-pay | Admitting: Family Medicine

## 2018-06-09 ENCOUNTER — Other Ambulatory Visit: Payer: Self-pay

## 2018-06-09 ENCOUNTER — Encounter: Payer: Self-pay | Admitting: Family Medicine

## 2018-06-09 ENCOUNTER — Telehealth: Payer: Self-pay | Admitting: Family Medicine

## 2018-06-09 VITALS — Resp 18

## 2018-06-09 DIAGNOSIS — J989 Respiratory disorder, unspecified: Secondary | ICD-10-CM

## 2018-06-09 MED ORDER — DOXYCYCLINE HYCLATE 100 MG PO TABS: 100.0000 mg | ORAL_TABLET | Freq: Two times a day (BID) | ORAL | 0 refills | Status: AC

## 2018-06-09 NOTE — Telephone Encounter (Signed)
-----   Message from Doren Custard, FNP sent at 06/06/2018  1:16 PM EDT ----- Regarding: Call to check in on patient - how is she donig? Call to check in on patient - how is she donig?  If not improving, may schedule telehealth visit Friday or Monday if not improving.

## 2018-06-09 NOTE — Patient Instructions (Addendum)
If you are not feeling better by Monday, please call or message our office.  If you get worse over the weekend, call 911 and let them know you possible have COVID-19.

## 2018-06-09 NOTE — Telephone Encounter (Signed)
Patient states she still feels lethargic and tired, her fever 99.6 F comes and goes. She has two days left of her medication. But she just feels overall bad. She can do a video chat with you today.

## 2018-06-09 NOTE — Progress Notes (Signed)
Name: Charlotte Thompson   MRN: [id number]    DOB: 1965/12/18   Date:06/09/2018       Progress Note  Subjective  Chief Complaint  Chief Complaint  Patient presents with  . Follow-up  . URI    Still feels bad and only has 2 pills left.  . Fatigue  . Low Grade Fever  . Shortness of Breath    Doesn't know what it is but is SOB     I connected with@ on 06/09/18 at  2:00 PM EDT by a video enabled telemedicine application and verified that I am speaking with the correct person using two identifiers.  I discussed the limitations of evaluation and management by telemedicine and the availability of in person appointments. The patient expressed understanding and agreed to proceed. Staff also discussed with the patient that there may be a patient responsible charge related to this service. Patient Location: Home Provider Location: Office Additional Individuals present: None  HPI  Pt video conferencing to follow up with concern for respiratory illness. Today she reports ongoing shortness of breath, some chest soreness - states this is more from coughing and is reproducible when she presses on her chest wall, some nausea, and is fatigued.  She denies loss of smell of taste.  Prednisone has not helped her respiratory symptoms, albuterol only helps slightly.  Previous Note 06/06/2018 : Started out Friday (06/02/18)/Saturday with sore throat and nasal congestion, Saturday/Sunday felt really horrible - very fatigued, joint pain/body aches, dry cough, some shortness of breath with mild to moderate activity.  No known COVID-19 contact in the last 2 weeks.  Last weekend she traveled to [place] to visit an Aunt, but no other travel. Temp at home has been around 99.4-99.71F orally. - Does have history of pneumonia 1998 and was hospitalized at that time.  Is a smoker.  No officially diagnosis of asthma or COPD.  She has not noticed any wheezing.   Patient Active Problem List   Diagnosis Date Noted  .  Major depressive disorder, recurrent (HCC) 12/06/2017  . 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 Prolonged grief reaction 09/23/2016  . Aggressive behavior 06/29/2016  . Fatigue 06/29/2016  . Vitamin D deficiency 06/29/2016  . Vitamin B12 deficiency 06/29/2016  . Microalbuminuria 12/27/2015  . Insomnia 12/27/2015  . Anxiety and depression 12/05/2015  . Irritability 06/21/2015  . Hypothyroidism 06/16/2015  . Tobacco abuse 06/16/2015  . Hypercholesterolemia 06/16/2015  . Medication monitoring encounter 06/16/2015  . Family history of chronic renal disease 06/16/2015  . Hx of iron deficiency anemia 06/16/2015  . Heart murmur 06/16/2015  . Benign cyst of skin 10/18/2014  . Hot flashes, menopausal 09/02/2014    Social History   Tobacco Use  . Smoking status: Current Every Day Smoker    Years: 16.00    Types: Cigarettes    Start date: 03/24/2014  . Smokeless tobacco: Never Used  Substance Use Topics  . Alcohol use: No    Alcohol/week: 0.0 standard drinks     Current Outpatient Medications:  .  albuterol (PROVENTIL HFA;VENTOLIN HFA) 108 (90 Base) MCG/ACT inhaler, Inhale 2 puffs into the lungs every 6 (six) hours as needed for wheezing or shortness of breath., Disp: 1 Inhaler, Rfl: 2 .  levothyroxine (SYNTHROID, LEVOTHROID) 112 MCG tablet, Take 1 tablet (112 mcg total) by mouth daily., Disp: 90 tablet, Rfl: 3 .  pravastatin (PRAVACHOL) 80 MG tablet, Take 1 tablet (80 mg total) by mouth at bedtime., Disp: 90  tablet, Rfl: 3 .  predniSONE (DELTASONE) 10 MG tablet, Take 6 tablets (60 mg total) by mouth daily with breakfast for 1 day, THEN 5 tablets (50 mg total) daily with breakfast for 1 day, THEN 4 tablets (40 mg total) daily with breakfast for 1 day, THEN 3 tablets (30 mg total) daily with breakfast for 1 day, THEN 2 tablets (20 mg total) daily with breakfast for 1 day, THEN 1 tablet (10 mg total) daily with breakfast for 1 day., Disp: 21 tablet, Rfl: 0 .   QUEtiapine (SEROQUEL) 50 MG tablet, Take 0.5 tablets (25 mg total) by mouth at bedtime. (Patient taking differently: Take 100 mg by mouth at bedtime. ), Disp: 15 tablet, Rfl: 5 .  venlafaxine XR (EFFEXOR-XR) 75 MG 24 hr capsule, Take 3 capsules (225 mg total) by mouth daily with breakfast., Disp: 90 capsule, Rfl: 5 .  docusate sodium (COLACE) 50 MG capsule, Take 50 mg by mouth daily as needed for mild constipation., Disp: , Rfl:   Allergies  Allergen Reactions  . Aspirin   . Codeine     I personally reviewed active problem list, medication list, allergies, notes from last encounter with the patient/caregiver today.  ROS  Ten systems reviewed and is negative except as mentioned in HPI.  Objective  Virtual encounter, vitals not obtained except for respiratory rate - 18/min. On ambulation (about 30 seconds) pt's respiratory rate remains stable at approx 18-20/min  There is no height or weight on file to calculate BMI.  Nursing Note and Vital Signs reviewed.  Physical Exam  Constitutional: Patient appears well-developed and well-nourished. No distress.  HENT: Head: Normocephalic and atraumatic.  Neck: Normal range of motion. Pulmonary/Chest: Effort normal. No respiratory distress. Speaking in complete sentences, even with ambulation throughout her home. Neurological: Pt is alert and oriented to person, place, and time. No cranial nerve deficit. Speech is normal. Psychiatric: Patient has a normal mood and affect. behavior is normal. Judgment and thought content normal.   No results found for this or any previous visit (from the past 72 hour(s)).  Assessment & Plan   1. Respiratory illness - Advised likely COVID-19 based on symptoms and recent travel to [facility] and lack of improvement with prednisone.  Some emerging studies are showing prednisone may cause elongation of symptoms, so we will d/c today as illness does not seem to be COPD/bronchitis in nature at this time.  She declines  azithromycin, though I did discuss potential benefit.  We will treat for potential pneumonia with doxycycline.  - doxycycline (VIBRA-TABS) 100 MG tablet; Take 1 tablet (100 mg total) by mouth 2 (two) times daily for 10 days.  Dispense: 20 tablet; Refill: 0 - Extending work note to next Wednesday  -Red flags and when to present for emergency care or RTC including fever >101.58F, chest pain, shortness of breath, new/worsening/un-resolving symptoms, reviewed with patient several times with teach back.  She will call ahead to 911 or ER if she decides she needs to go in and advise that she is a suspected COVID-19 patient so that they may properly instruct her  Follow up and care instructions discussed and provided in AVS. - I discussed the assessment and treatment plan with the patient. The patient was provided an opportunity to ask questions and all were answered. The patient agreed with the plan and demonstrated an understanding of the instructions.  I provided 20 minutes of non-face-to-face time during this encounter.  Doren Custard, FNP

## 2018-06-11 ENCOUNTER — Encounter: Payer: Self-pay | Admitting: Family Medicine

## 2018-06-12 ENCOUNTER — Other Ambulatory Visit: Payer: Self-pay | Admitting: Family Medicine

## 2018-06-12 ENCOUNTER — Ambulatory Visit (INDEPENDENT_AMBULATORY_CARE_PROVIDER_SITE_OTHER): Payer: HRSA Program | Admitting: Family Medicine

## 2018-06-12 ENCOUNTER — Emergency Department
Admission: EM | Admit: 2018-06-12 | Discharge: 2018-06-12 | Disposition: A | Discharge status: Home / Self Care | Payer: Self-pay | Attending: Emergency Medicine | Admitting: Emergency Medicine

## 2018-06-12 ENCOUNTER — Emergency Department: Payer: Self-pay

## 2018-06-12 ENCOUNTER — Encounter: Payer: Self-pay | Admitting: Family Medicine

## 2018-06-12 ENCOUNTER — Other Ambulatory Visit: Payer: Self-pay

## 2018-06-12 DIAGNOSIS — R509 Fever, unspecified: Secondary | ICD-10-CM

## 2018-06-12 DIAGNOSIS — R5383 Other fatigue: Secondary | ICD-10-CM | POA: Diagnosis not present

## 2018-06-12 DIAGNOSIS — R0602 Shortness of breath: Secondary | ICD-10-CM

## 2018-06-12 DIAGNOSIS — Z79899 Other long term (current) drug therapy: Secondary | ICD-10-CM | POA: Insufficient documentation

## 2018-06-12 DIAGNOSIS — F17210 Nicotine dependence, cigarettes, uncomplicated: Secondary | ICD-10-CM | POA: Insufficient documentation

## 2018-06-12 DIAGNOSIS — Z20828 Contact with and (suspected) exposure to other viral communicable diseases: Secondary | ICD-10-CM | POA: Diagnosis not present

## 2018-06-12 DIAGNOSIS — J989 Respiratory disorder, unspecified: Secondary | ICD-10-CM

## 2018-06-12 DIAGNOSIS — J181 Lobar pneumonia, unspecified organism: Secondary | ICD-10-CM

## 2018-06-12 DIAGNOSIS — J189 Pneumonia, unspecified organism: Secondary | ICD-10-CM | POA: Insufficient documentation

## 2018-06-12 LAB — CBC WITH DIFFERENTIAL/PLATELET
Abs Immature Granulocytes: 0.32 10*3/uL — ABNORMAL HIGH (ref 0.00–0.07)
Basophils Absolute: 0.1 10*3/uL (ref 0.0–0.1)
Basophils Relative: 1 %
Eosinophils Absolute: 0.3 10*3/uL (ref 0.0–0.5)
Eosinophils Relative: 2 %
HCT: 42.2 % (ref 36.0–46.0)
Hemoglobin: 14.3 g/dL (ref 12.0–15.0)
Immature Granulocytes: 3 %
Lymphocytes Relative: 20 %
Lymphs Abs: 2.5 10*3/uL (ref 0.7–4.0)
MCH: 32.4 pg (ref 26.0–34.0)
MCHC: 33.9 g/dL (ref 30.0–36.0)
MCV: 95.5 fL (ref 80.0–100.0)
Monocytes Absolute: 0.9 10*3/uL (ref 0.1–1.0)
Monocytes Relative: 7 %
Neutro Abs: 8.3 10*3/uL — ABNORMAL HIGH (ref 1.7–7.7)
Neutrophils Relative %: 67 %
Platelets: 241 10*3/uL (ref 150–400)
RBC: 4.42 MIL/uL (ref 3.87–5.11)
RDW: 12.3 % (ref 11.5–15.5)
WBC: 12.4 10*3/uL — ABNORMAL HIGH (ref 4.0–10.5)
nRBC: 0 % (ref 0.0–0.2)

## 2018-06-12 LAB — BASIC METABOLIC PANEL
Anion gap: 6 (ref 5–15)
BUN: 16 mg/dL (ref 6–20)
CO2: 26 mmol/L (ref 22–32)
Calcium: 9.3 mg/dL (ref 8.9–10.3)
Chloride: 105 mmol/L (ref 98–111)
Creatinine, Ser: 0.81 mg/dL (ref 0.44–1.00)
GFR calc Af Amer: >60 mL/min (ref >60)
GFR calc non Af Amer: >60 mL/min (ref >60)
Glucose, Bld: 91 mg/dL (ref 70–99)
Potassium: 4.3 mmol/L (ref 3.5–5.1)
Sodium: 137 mmol/L (ref 135–145)

## 2018-06-12 LAB — INFLUENZA PANEL BY PCR (TYPE A & B)
Influenza A By PCR: NEGATIVE
Influenza B By PCR: NEGATIVE

## 2018-06-12 NOTE — Progress Notes (Signed)
Name: Charlotte Thompson   MRN: [id number]    DOB: 06/08/1965   Date:06/12/2018       Progress Note  Subjective  Chief Complaint  Chief Complaint  Patient presents with  . Follow-up    no enery, not feeling any better    I connected with@ on 06/12/18 at 10:20 AM EDT by telephone and verified that I am speaking with the correct person using two identifiers.   I discussed the limitations, risks, security and privacy concerns of performing an evaluation and management service by telephone and the availability of in person appointments. Staff also discussed with the patient that there may be a patient responsible charge related to this service. Patient Location: Home Provider Location: Home Additional Individuals present: None  HPI  Pt presents via telephone encounter as her video camera is not working.  She is experiencing significant worsening of her fatigue over the last several days since our last visit.  Her shortness of breath has not worsened since our last visit - still feels winded with ambulation, but no labored breathing.  She notes low-grade temps at home.  She is taking doxycycline and this does not seem to be helping.  No NVD, no palpitations or abdominal pain.  Previous note 06/09/2018: Pt video conferencing to follow up with concern for respiratory illness.Today she reports ongoing shortness of breath, some chest soreness - states this is more from coughing and is reproducible when she presses on her chest wall, some nausea, and is fatigued.  She denies loss of smell of taste.  Prednisone has not helped her respiratory symptoms, albuterol only helps slightly.  Previous Note 06/06/2018 : Started out Friday(06/02/18)/Saturday with sore throat and nasal congestion, Saturday/Sunday felt really horrible - very fatigued, joint pain/body aches, dry cough,someshortness of breath with mild to moderate activity. No known COVID-19 contact in the last 2 weeks. Last weekend she traveled  to [place] to visit an Aunt, but no other travel. Temp at home has been around 99.4-99.16F orally. - Does have history of pneumonia 1998 and was hospitalized at that time.Is a smoker. No officially diagnosis of asthma or COPD. She has not noticed any wheezing.  Patient Active Problem List   Diagnosis Date Noted  . Major depressive disorder, recurrent (HCC) 12/06/2017  . 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 Prolonged grief reaction 09/23/2016  . Aggressive behavior 06/29/2016  . Fatigue 06/29/2016  . Vitamin D deficiency 06/29/2016  . Vitamin B12 deficiency 06/29/2016  . Microalbuminuria 12/27/2015  . Insomnia 12/27/2015  . Anxiety and depression 12/05/2015  . Irritability 06/21/2015  . Hypothyroidism 06/16/2015  . Tobacco abuse 06/16/2015  . Hypercholesterolemia 06/16/2015  . Medication monitoring encounter 06/16/2015  . Family history of chronic renal disease 06/16/2015  . Hx of iron deficiency anemia 06/16/2015  . Heart murmur 06/16/2015  . Benign cyst of skin 10/18/2014  . Hot flashes, menopausal 09/02/2014    Social History   Tobacco Use  . Smoking status: Current Every Day Smoker    Years: 16.00    Types: Cigarettes    Start date: 03/24/2014  . Smokeless tobacco: Never Used  Substance Use Topics  . Alcohol use: No    Alcohol/week: 0.0 standard drinks     Current Outpatient Medications:  .  albuterol (PROVENTIL HFA;VENTOLIN HFA) 108 (90 Base) MCG/ACT inhaler, Inhale 2 puffs into the lungs every 6 (six) hours as needed for wheezing or shortness of breath., Disp: 1 Inhaler, Rfl: 2 .  docusate  sodium (COLACE) 50 MG capsule, Take 50 mg by mouth daily as needed for mild constipation., Disp: , Rfl:  .  doxycycline (VIBRA-TABS) 100 MG tablet, Take 1 tablet (100 mg total) by mouth 2 (two) times daily for 10 days., Disp: 20 tablet, Rfl: 0 .  levothyroxine (SYNTHROID, LEVOTHROID) 112 MCG tablet, Take 1 tablet (112 mcg total) by mouth  daily., Disp: 90 tablet, Rfl: 3 .  pravastatin (PRAVACHOL) 80 MG tablet, Take 1 tablet (80 mg total) by mouth at bedtime., Disp: 90 tablet, Rfl: 3 .  QUEtiapine (SEROQUEL) 50 MG tablet, Take 0.5 tablets (25 mg total) by mouth at bedtime. (Patient taking differently: Take 100 mg by mouth at bedtime. ), Disp: 15 tablet, Rfl: 5 .  venlafaxine XR (EFFEXOR-XR) 75 MG 24 hr capsule, Take 3 capsules (225 mg total) by mouth daily with breakfast., Disp: 90 capsule, Rfl: 5  Allergies  Allergen Reactions  . Aspirin   . Codeine     I personally reviewed active problem list, medication list, allergies, lab results with the patient/caregiver today.  ROS  Ten systems reviewed and is negative except as mentioned in HPI  Objective  Virtual encounter, vitals not obtained.  There is no height or weight on file to calculate BMI.  Nursing Note and Vital Signs reviewed.  Physical Exam  Unable to perform physical examination or provide visual observations.  On telephone patient sounds fatigued but does answer questions appropriately and in complete sentences.  Oriented x4. Dry cough is present throughout our conversation.  No results found for this or any previous visit (from the past 72 hour(s)).  Assessment & Plan  1. Respiratory illness 2. Shortness of breath 3. Fatigue, unspecified type - Advised ER for further evaluation as I am concerned for COVID-19 vs possible sepsis vs other etiology.  Patient is much more fatigued today, cough seems to be more frequent, shortness of breath seems to be stable.  She declines EMS transport and will have her nephew take her.  I did call [facility] ER to attempt to give report and obtain any additional instructions.  Clydie Braun RN asked for name and DOB and CC - declined additional details at time of call.  - Did discuss patient with Dr. Sherie Don PCP who is made aware of patient's disposition. - I discussed the assessment and treatment plan with the patient. The patient was  provided an opportunity to ask questions and all were answered. The patient agreed with the plan and demonstrated an understanding of the instructions.  - The patient was advised to call back or seek an in-person evaluation if the symptoms worsen or if the condition fails to improve as anticipated.  I provided 13 minutes of non-face-to-face time during this encounter.  Doren Custard, FNP

## 2018-06-12 NOTE — ED Notes (Signed)
EDP at bedside  

## 2018-06-12 NOTE — Telephone Encounter (Signed)
-----   Message from Doren Custard, FNP sent at 06/09/2018  2:29 PM EDT ----- Regarding: Call patient to check in Please call and check in on patient - how is she feeling?

## 2018-06-12 NOTE — ED Triage Notes (Signed)
Pt states "im really tired and when I move around I get short winded." states symptoms since Friday. Cough, congestion, nasal drainage. States temp of 99.6 that comes and goes. No tylenol/motrin today. Denies being around anyone with similar symptoms. Denies travel. Denies weight gain or leg swelling. Denies asthmas or COPD.

## 2018-06-12 NOTE — ED Provider Notes (Signed)
[facility] Emergency Department Provider Note ____________________________________________   First MD Initiated Contact with Patient 06/12/18 1145     (approximate)  I have reviewed the triage vital signs and the nursing notes.   HISTORY  Chief Complaint Cough    HPI Charlotte Thompson is a 53 y.o. female with PMH as noted below who presents with shortness of breath over the last 3 days, gradual onset, worse with exertion, and associated with generalized fatigue and malaise.  She reports a low-grade temperature of 99.6 intermittently.  No vomiting or diarrhea.  No leg swelling or pain.  No recent sick contacts or exposure to anyone who is at high risk for COVID-19.  Past Medical History:  Diagnosis Date  . GERD (gastroesophageal reflux disease)   . Hemorrhoid   . Hyperlipidemia   . Pneumonia     Patient Active Problem List   Diagnosis Date Noted  . Major depressive disorder, recurrent (HCC) 12/06/2017  . 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 Prolonged grief reaction 09/23/2016  . Aggressive behavior 06/29/2016  . Fatigue 06/29/2016  . Vitamin D deficiency 06/29/2016  . Vitamin B12 deficiency 06/29/2016  . Microalbuminuria 12/27/2015  . Insomnia 12/27/2015  . Anxiety and depression 12/05/2015  . Irritability 06/21/2015  . Hypothyroidism 06/16/2015  . Tobacco abuse 06/16/2015  . Hypercholesterolemia 06/16/2015  . Medication monitoring encounter 06/16/2015  . Family history of chronic renal disease 06/16/2015  . Hx of iron deficiency anemia 06/16/2015  . Heart murmur 06/16/2015  . Benign cyst of skin 10/18/2014  . Hot flashes, menopausal 09/02/2014    Past Surgical History:  Procedure Laterality Date  . COLONOSCOPY    . PARTIAL HYSTERECTOMY  2010    Prior to Admission medications   Medication Sig Start Date End Date Taking? Authorizing Provider  albuterol (PROVENTIL HFA;VENTOLIN HFA) 108 (90 Base) MCG/ACT  inhaler Inhale 2 puffs into the lungs every 6 (six) hours as needed for wheezing or shortness of breath. 06/06/18   Doren CustardBoyce, Emily E, FNP  docusate sodium (COLACE) 50 MG capsule Take 50 mg by mouth daily as needed for mild constipation.    [provider]  doxycycline (VIBRA-TABS) 100 MG tablet Take 1 tablet (100 mg total) by mouth 2 (two) times daily for 10 days. 06/09/18 06/19/18  Doren CustardBoyce, Emily E, FNP  levothyroxine (SYNTHROID, LEVOTHROID) 112 MCG tablet Take 1 tablet (112 mcg total) by mouth daily. 08/10/17   Kerman PasseyLada, Melinda P, MD  pravastatin (PRAVACHOL) 80 MG tablet Take 1 tablet (80 mg total) by mouth at bedtime. 07/12/17   Kerman PasseyLada, Melinda P, MD  QUEtiapine (SEROQUEL) 50 MG tablet Take 0.5 tablets (25 mg total) by mouth at bedtime. Patient taking differently: Take 100 mg by mouth at bedtime.  04/20/18   Kerman PasseyLada, Melinda P, MD  venlafaxine XR (EFFEXOR-XR) 75 MG 24 hr capsule Take 3 capsules (225 mg total) by mouth daily with breakfast. 04/20/18   Lada, Janit BernMelinda P, MD    Allergies Aspirin and Codeine  Family History  Problem Relation Age of Onset  . Autoimmune disease Father   . Kidney failure Father   . Heart disease Father   . Heart attack Father   . Hypertension Father   . Hyperlipidemia Father   . Clotting disorder Mother   . Kidney disease Mother   . Heart attack Mother   . Thyroid disease Sister   . Cancer Sister        breast  . Breast cancer Sister [id number]  .  Thyroid disease Daughter   . Diabetes Paternal Grandmother   . Diabetes type II Paternal Grandmother   . Hypertension Daughter   . Hearing loss Maternal Grandmother   . Cancer Maternal Grandmother        colon  . Cancer Maternal Grandfather        prostate  . Pneumonia Paternal Grandfather   . Cancer Paternal Grandfather        bone    Social History Social History   Tobacco Use  . Smoking status: Current Every Day Smoker    Years: 16.00    Types: Cigarettes    Start date: 03/24/2014  . Smokeless tobacco: Never Used   Substance Use Topics  . Alcohol use: No    Alcohol/week: 0.0 standard drinks  . Drug use: No    Review of Systems  Constitutional: Positive for low-grade fever and malaise. Eyes: No redness. ENT: Positive for nasal congestion and rhinorrhea. Cardiovascular: Denies chest pain. Respiratory: Positive for shortness of breath. Gastrointestinal: No vomiting or diarrhea.  Genitourinary: Negative for flank pain.  Musculoskeletal: Negative for back pain. Skin: Negative for rash. Neurological: Negative for headache.   ____________________________________________   PHYSICAL EXAM:  VITAL SIGNS: ED Triage Vitals  Enc Vitals Group     BP 06/12/18 1135 (!) 143/88     Pulse Rate 06/12/18 1135 79     Resp --      Temp 06/12/18 1135 98.5 F (36.9 C)     Temp Source 06/12/18 1135 Oral     SpO2 06/12/18 1135 97 %     Weight 06/12/18 1135 153 lb (69.4 kg)     Height 06/12/18 1135 5\' 6"  (1.676 m)     Head Circumference --      Peak Flow --      Pain Score 06/12/18 1139 0     Pain Loc --      Pain Edu? --      Excl. in GC? --     Constitutional: Alert and oriented.  Typically well appearing and in no acute distress. Eyes: Conjunctivae are normal.  Head: Atraumatic. Nose: Nasal congestion. Mouth/Throat: Mucous membranes are moist.   Neck: Normal range of motion.  Cardiovascular: Normal rate, regular rhythm. Grossly normal heart sounds.  Good peripheral circulation. Respiratory: Normal respiratory effort.  No retractions. Lungs CTAB. Gastrointestinal: No distention.  Musculoskeletal: Extremities warm and well perfused.  Neurologic:  Normal speech and language. No gross focal neurologic deficits are appreciated.  Skin:  Skin is warm and dry. No rash noted. Psychiatric: Mood and affect are normal. Speech and behavior are normal.  ____________________________________________   LABS (all labs ordered are listed, but only abnormal results are displayed)  Labs Reviewed  CBC WITH  DIFFERENTIAL/PLATELET - Abnormal; Notable for the following components:      Result Value   WBC 12.4 (*)    Neutro Abs 8.3 (*)    Abs Immature Granulocytes 0.32 (*)    All other components within normal limits  BASIC METABOLIC PANEL  INFLUENZA PANEL BY PCR (TYPE A & B)   ____________________________________________  EKG   ____________________________________________  RADIOLOGY  CXR: Hazy right lower lobe opacity  ____________________________________________   PROCEDURES  Procedure(s) performed: No  Procedures  Critical Care performed: No ____________________________________________   INITIAL IMPRESSION / ASSESSMENT AND PLAN / ED COURSE  Pertinent labs & imaging results that were available during my care of the patient were reviewed by me and considered in my medical decision making (see chart  for details).  53 year old female with PMH as noted above presents with fatigue and malaise, low-grade fever, and shortness of breath over the last several days.  She has no known exposures to people at high risk for COVID-19 or any travel outside of the state.  On exam the patient is overall well-appearing and her vital signs are normal.  She has no respiratory distress or increased work of breathing.  Her lungs are clear.  O2 saturation is in the high 90s on room air.  The remainder of the exam is as described above.  Differential includes acute bronchitis, pneumonia, influenza or other viral syndrome, or less likely COVID-19.  We will obtain chest x-ray and labs and reassess.  ----------------------------------------- 4:08 PM on 06/12/2018 -----------------------------------------  Chest x-ray showed right lower lobe hazy infiltrate.  The patient was started on doxycycline 3 days ago for presumed pneumonia, so this should be adequate coverage.  The lab work-up is unremarkable and her influenza swab is negative.  At this time, the patient does not meet testing criteria for  COVID-19.  She has no increased respiratory effort, is oxygenating normally, and is stable for discharge home.  I counseled her on the results of the work-up and instructed her to continue doxycycline and her other medications.  I gave her thorough return precautions and she expressed understanding.  In addition, I provided current self-isolation guidelines.  ____  Charlotte Epley was evaluated in Emergency Department on 06/12/2018 for the symptoms described in the history of present illness. She was evaluated in the context of the global COVID-19 pandemic, which necessitated consideration that the patient might be at risk for infection with the SARS-CoV-2 virus that causes COVID-19. Institutional protocols and algorithms that pertain to the evaluation of patients at risk for COVID-19 are in a state of rapid change based on information released by regulatory bodies including the CDC and federal and state organizations. These policies and algorithms were followed during the patient's care in the ED. ____________________________________________   FINAL CLINICAL IMPRESSION(S) / ED DIAGNOSES  Final diagnoses:  Community acquired pneumonia of right lower lobe of lung (HCC)      NEW MEDICATIONS STARTED DURING THIS VISIT:  Discharge Medication List as of 06/12/2018  3:17 PM       Note:  This document was prepared using Dragon voice recognition software and may include unintentional dictation errors.    Dionne Bucy, MD 06/12/18 [phone]

## 2018-06-12 NOTE — ED Notes (Signed)
X-ray at bedside

## 2018-06-12 NOTE — Telephone Encounter (Signed)
Video visit initiated.

## 2018-06-12 NOTE — Discharge Instructions (Addendum)
To need the doxycycline as prescribed and finish the full course.  Take your other medications as prescribed.  Return to the ER for new or worsening shortness of breath, persistent high fever, weakness, or any other new or worsening symptoms that concern you.  Although you do not meet COVID-19 testing criteria at this time, it is still possible that you have it so you will need to self isolate.       Person Under Monitoring Name: Charlotte Thompson  Location: [address] [place] [address]   Infection Prevention Recommendations for Individuals Confirmed to have, or Being Evaluated for, 2019 Novel Coronavirus (COVID-19) Infection Who Receive Care at Home  Individuals who are confirmed to have, or are being evaluated for, COVID-19 should follow the prevention steps below until a healthcare provider or local or state health department says they can return to normal activities.  Stay home except to get medical care You should restrict activities outside your home, except for getting medical care. Do not go to work, school, or public areas, and do not use public transportation or taxis.  Call ahead before visiting your doctor Before your medical appointment, call the healthcare provider and tell them that you have, or are being evaluated for, COVID-19 infection. This will help the healthcare providers office take steps to keep other people from getting infected. Ask your healthcare provider to call the local or state health department.  Monitor your symptoms Seek prompt medical attention if your illness is worsening (e.g., difficulty breathing). Before going to your medical appointment, call the healthcare provider and tell them that you have, or are being evaluated for, COVID-19 infection. Ask your healthcare provider to call the local or state health department.  Wear a facemask You should wear a facemask that covers your nose and mouth when you are in the same room with  other people and when you visit a healthcare provider. People who live with or visit you should also wear a facemask while they are in the same room with you.  Separate yourself from other people in your home As much as possible, you should stay in a different room from other people in your home. Also, you should use a separate bathroom, if available.  Avoid sharing household items You should not share dishes, drinking glasses, cups, eating utensils, towels, bedding, or other items with other people in your home. After using these items, you should wash them thoroughly with soap and water.  Cover your coughs and sneezes Cover your mouth and nose with a tissue when you cough or sneeze, or you can cough or sneeze into your sleeve. Throw used tissues in a lined trash can, and immediately wash your hands with soap and water for at least 20 seconds or use an alcohol-based hand rub.  Wash your [employer] your hands often and thoroughly with soap and water for at least 20 seconds. You can use an alcohol-based hand sanitizer if soap and water are not available and if your hands are not visibly dirty. Avoid touching your eyes, nose, and mouth with unwashed hands.   Prevention Steps for Caregivers and Household Members of Individuals Confirmed to have, or Being Evaluated for, COVID-19 Infection Being Cared for in the Home  If you live with, or provide care at home for, a person confirmed to have, or being evaluated for, COVID-19 infection please follow these guidelines to prevent infection:  Follow healthcare providers instructions Make sure that you understand and can  help the patient follow any healthcare provider instructions for all care.  Provide for the patients basic needs You should help the patient with basic needs in the home and provide support for getting groceries, prescriptions, and other personal needs.  Monitor the patients symptoms If they are getting sicker, call his  or her medical provider and tell them that the patient has, or is being evaluated for, COVID-19 infection. This will help the healthcare providers office take steps to keep other people from getting infected. Ask the healthcare provider to call the local or state health department.  Limit the number of people who have contact with the patient If possible, have only one caregiver for the patient. Other household members should stay in another home or place of residence. If this is not possible, they should stay in another room, or be separated from the patient as much as possible. Use a separate bathroom, if available. Restrict visitors who do not have an essential need to be in the home.  Keep older adults, very young children, and other sick people away from the patient Keep older adults, very young children, and those who have compromised immune systems or chronic health conditions away from the patient. This includes people with chronic heart, lung, or kidney conditions, diabetes, and cancer.  Ensure good ventilation Make sure that shared spaces in the home have good air flow, such as from an air conditioner or an opened window, weather permitting.  Wash your hands often Wash your hands often and thoroughly with soap and water for at least 20 seconds. You can use an alcohol based hand sanitizer if soap and water are not available and if your hands are not visibly dirty. Avoid touching your eyes, nose, and mouth with unwashed hands. Use disposable paper towels to dry your hands. If not available, use dedicated cloth towels and replace them when they become wet.  Wear a facemask and gloves Wear a disposable facemask at all times in the room and gloves when you touch or have contact with the patients blood, body fluids, and/or secretions or excretions, such as sweat, saliva, sputum, nasal mucus, vomit, urine, or feces.  Ensure the mask fits over your nose and mouth tightly, and do not touch  it during use. Throw out disposable facemasks and gloves after using them. Do not reuse. Wash your hands immediately after removing your facemask and gloves. If your personal clothing becomes contaminated, carefully remove clothing and launder. Wash your hands after handling contaminated clothing. Place all used disposable facemasks, gloves, and other waste in a lined container before disposing them with other household waste. Remove gloves and wash your hands immediately after handling these items.  Do not share dishes, glasses, or other household items with the patient Avoid sharing household items. You should not share dishes, drinking glasses, cups, eating utensils, towels, bedding, or other items with a patient who is confirmed to have, or being evaluated for, COVID-19 infection. After the person uses these items, you should wash them thoroughly with soap and water.  Wash laundry thoroughly Immediately remove and wash clothes or bedding that have blood, body fluids, and/or secretions or excretions, such as sweat, saliva, sputum, nasal mucus, vomit, urine, or feces, on them. Wear gloves when handling laundry from the patient. Read and follow directions on labels of laundry or clothing items and detergent. In general, wash and dry with the warmest temperatures recommended on the label.  Clean all areas the individual has used often Clean all touchable  surfaces, such as counters, tabletops, doorknobs, bathroom fixtures, toilets, phones, keyboards, tablets, and bedside tables, every day. Also, clean any surfaces that may have blood, body fluids, and/or secretions or excretions on them. Wear gloves when cleaning surfaces the patient has come in contact with. Use a diluted bleach solution (e.g., dilute bleach with 1 part bleach and 10 parts water) or a household disinfectant with a label that says EPA-registered for coronaviruses. To make a bleach solution at home, add 1 tablespoon of bleach to 1  quart (4 cups) of water. For a larger supply, add  cup of bleach to 1 gallon (16 cups) of water. Read labels of cleaning products and follow recommendations provided on product labels. Labels contain instructions for safe and effective use of the cleaning product including precautions you should take when applying the product, such as wearing gloves or eye protection and making sure you have good ventilation during use of the product. Remove gloves and wash hands immediately after cleaning.  Monitor yourself for signs and symptoms of illness Caregivers and household members are considered close contacts, should monitor their health, and will be asked to limit movement outside of the home to the extent possible. Follow the monitoring steps for close contacts listed on the symptom monitoring form.   ? If you have additional questions, contact your local health department or call the epidemiologist on call at [phone] (available 24/7). ? This guidance is subject to change. For the most up-to-date guidance from [facility], please refer to their website: YouBlogs.pl

## 2018-06-12 NOTE — ED Notes (Signed)
Pt ambulatory to toilet with steady gait noted.  

## 2018-06-13 ENCOUNTER — Telehealth: Payer: Self-pay | Admitting: Family Medicine

## 2018-06-13 NOTE — Telephone Encounter (Signed)
Mckenzie,from [facility] calling to request a new prescription for Quetiapine 100 mg. Pt states that the prescription has increased from 50 mg to 100 mg. New prescription, if appropriate could be faxed [phone] or pharmacy representative can be contacted at [phone] option #3.

## 2018-06-13 NOTE — Telephone Encounter (Signed)
Who increased it to 100 mg? I didn't I had her down to 25 mg at bedtime Higher doses increase risk of QT prolongation, diabetes, heart attacks, etc. We need to use the lowest dose possible If another provider increased it to 100 mg, please have her contact that provider Thank you

## 2018-06-14 NOTE — Telephone Encounter (Signed)
Pt was extremely upset. She said to "forget it all together, she's sick of dealing with it being an issue to get her medicine".  I offered a virtual visit so the issue could be discussed and she refused.

## 2018-06-16 ENCOUNTER — Other Ambulatory Visit: Payer: Self-pay | Admitting: Family Medicine

## 2018-06-16 DIAGNOSIS — E7800 Pure hypercholesterolemia, unspecified: Secondary | ICD-10-CM

## 2018-06-16 DIAGNOSIS — E559 Vitamin D deficiency, unspecified: Secondary | ICD-10-CM

## 2018-06-16 DIAGNOSIS — E038 Other specified hypothyroidism: Secondary | ICD-10-CM

## 2018-06-16 DIAGNOSIS — Z5181 Encounter for therapeutic drug level monitoring: Secondary | ICD-10-CM

## 2018-06-16 NOTE — Telephone Encounter (Signed)
Lab Results  Component Value Date   CHOL 206 (H) 07/08/2017   HDL 53 07/08/2017   LDLCALC 128 (H) 07/08/2017   TRIG 137 07/08/2017   CHOLHDL 3.9 07/08/2017   Lab Results  Component Value Date   ALT 13 07/08/2017   Please contact the patient It has been almost a year since she had her cholesterol checked Please ask her to have labs done around April 26th or just after (I'll order) and then schedule her for a telehealth visit a few days after that to go over all of her labs I'll refill her medicine in the meantime Thank you

## 2018-06-19 NOTE — Telephone Encounter (Signed)
Left detailed voicemail

## 2018-07-10 ENCOUNTER — Telehealth: Payer: Self-pay | Admitting: Family Medicine

## 2018-07-10 NOTE — Telephone Encounter (Signed)
Please explain walk in lab hours. Thanks

## 2018-07-10 NOTE — Telephone Encounter (Signed)
Copied from CRM [id number]. Topic: Appointment Scheduling - Scheduling Inquiry for Clinic >> Jul 10, 2018  9:40 AM Jens Som A wrote: CRM for notification. See Telephone encounter for: 07/10/18.  Attempted to contact the office three times to schedule a lab appt. The phone was busy each time. Please advise with the patient thank you.

## 2018-07-10 NOTE — Telephone Encounter (Signed)
Left voice message informing pt that our lab hours are 8-12 and 2-4. During the covid-19 period we are asking that labs are done during the 8-12 time frame due to [place] possibly seeing any sick patients in the afternoon.

## 2018-07-16 ENCOUNTER — Other Ambulatory Visit: Payer: Self-pay | Admitting: Family Medicine

## 2018-07-17 ENCOUNTER — Other Ambulatory Visit: Payer: Self-pay

## 2018-07-17 DIAGNOSIS — E7800 Pure hypercholesterolemia, unspecified: Secondary | ICD-10-CM

## 2018-07-17 DIAGNOSIS — Z5181 Encounter for therapeutic drug level monitoring: Secondary | ICD-10-CM

## 2018-07-17 DIAGNOSIS — E559 Vitamin D deficiency, unspecified: Secondary | ICD-10-CM

## 2018-07-17 DIAGNOSIS — E038 Other specified hypothyroidism: Secondary | ICD-10-CM

## 2018-07-17 NOTE — Telephone Encounter (Signed)
Patient needs to come in for labs that have been ordered already to recheck TSH.

## 2018-07-17 NOTE — Telephone Encounter (Signed)
Pt.notified

## 2018-07-20 ENCOUNTER — Encounter: Payer: Self-pay | Admitting: Family Medicine

## 2018-07-20 LAB — LIPID PANEL
Cholesterol: 173 mg/dL (ref <200)
HDL: 45 mg/dL — ABNORMAL LOW (ref >=50)
LDL Cholesterol (Calc): 104 mg/dL (calc) — ABNORMAL HIGH
Non-HDL Cholesterol (Calc): 128 mg/dL (calc) (ref <130)
Total CHOL/HDL Ratio: 3.8 (calc) (ref <5.0)
Triglycerides: 141 mg/dL (ref <150)

## 2018-07-20 LAB — CBC WITH DIFFERENTIAL/PLATELET
Absolute Monocytes: 647 cells/uL (ref 200–950)
Basophils Absolute: 59 cells/uL (ref 0–200)
Basophils Relative: 0.7 %
Eosinophils Absolute: 193 cells/uL (ref 15–500)
Eosinophils Relative: 2.3 %
HCT: 38.9 % (ref 35.0–45.0)
Hemoglobin: 13.4 g/dL (ref 11.7–15.5)
Lymphs Abs: 1747 cells/uL (ref 850–3900)
MCH: 32.4 pg (ref 27.0–33.0)
MCHC: 34.4 g/dL (ref 32.0–36.0)
MCV: 94.2 fL (ref 80.0–100.0)
MPV: 9.7 fL (ref 7.5–12.5)
Monocytes Relative: 7.7 %
Neutro Abs: 5754 cells/uL (ref 1500–7800)
Neutrophils Relative %: 68.5 %
Platelets: 268 10*3/uL (ref 140–400)
RBC: 4.13 10*6/uL (ref 3.80–5.10)
RDW: 12.7 % (ref 11.0–15.0)
Total Lymphocyte: 20.8 %
WBC: 8.4 10*3/uL (ref 3.8–10.8)

## 2018-07-20 LAB — VITAMIN D 25 HYDROXY (VIT D DEFICIENCY, FRACTURES): Vit D, 25-Hydroxy: 36 ng/mL (ref 30–100)

## 2018-07-20 LAB — HEPATIC FUNCTION PANEL
AG Ratio: 2.2 (calc) (ref 1.0–2.5)
ALT: 11 U/L (ref 6–29)
AST: 14 U/L (ref 10–35)
Albumin: 4.1 g/dL (ref 3.6–5.1)
Alkaline phosphatase (APISO): 60 U/L (ref 37–153)
Bilirubin, Direct: 0 mg/dL (ref 0.0–0.2)
Globulin: 1.9 g/dL (calc) (ref 1.9–3.7)
Indirect Bilirubin: 0.3 mg/dL (calc) (ref 0.2–1.2)
Total Bilirubin: 0.3 mg/dL (ref 0.2–1.2)
Total Protein: 6 g/dL — ABNORMAL LOW (ref 6.1–8.1)

## 2018-07-20 LAB — TSH: TSH: 4.15 mIU/L

## 2018-07-21 ENCOUNTER — Encounter: Payer: Self-pay | Admitting: Family Medicine

## 2018-07-21 ENCOUNTER — Other Ambulatory Visit: Payer: Self-pay

## 2018-07-21 ENCOUNTER — Ambulatory Visit (INDEPENDENT_AMBULATORY_CARE_PROVIDER_SITE_OTHER): Payer: Self-pay | Admitting: Family Medicine

## 2018-07-21 VITALS — BP 122/82 | HR 85 | Temp 98.1°F | Resp 14 | Ht 63.0 in | Wt 148.0 lb

## 2018-07-21 DIAGNOSIS — J0140 Acute pansinusitis, unspecified: Secondary | ICD-10-CM

## 2018-07-21 DIAGNOSIS — R110 Nausea: Secondary | ICD-10-CM

## 2018-07-21 MED ORDER — FLUTICASONE PROPIONATE 50 MCG/ACT NA SUSP: 2.0000 | Freq: Every day | NASAL | 6 refills | Status: DC

## 2018-07-21 MED ORDER — ONDANSETRON HCL 4 MG PO TABS: 4.0000 mg | ORAL_TABLET | Freq: Three times a day (TID) | ORAL | 0 refills | Status: DC | PRN

## 2018-07-21 MED ORDER — AMOXICILLIN-POT CLAVULANATE 875-125 MG PO TABS: 1.0000 | ORAL_TABLET | Freq: Two times a day (BID) | ORAL | 0 refills | Status: AC

## 2018-07-21 NOTE — Patient Instructions (Signed)
Sinusitis, Adult Sinusitis is soreness and swelling (inflammation) of your sinuses. Sinuses are hollow spaces in the bones around your face. They are located:  Around your eyes.  In the middle of your forehead.  Behind your nose.  In your cheekbones. Your sinuses and nasal passages are lined with a fluid called mucus. Mucus drains out of your sinuses. Swelling can trap mucus in your sinuses. This lets germs (bacteria, virus, or fungus) grow, which leads to infection. Most of the time, this condition is caused by a virus. What are the causes? This condition is caused by:  Allergies.  Asthma.  Germs.  Things that block your nose or sinuses.  Growths in the nose (nasal polyps).  Chemicals or irritants in the air.  Fungus (rare). What increases the risk? You are more likely to develop this condition if:  You have a weak body defense system (immune system).  You do a lot of swimming or diving.  You use nasal sprays too much.  You smoke. What are the signs or symptoms? The main symptoms of this condition are pain and a feeling of pressure around the sinuses. Other symptoms include:  Stuffy nose (congestion).  Runny nose (drainage).  Swelling and warmth in the sinuses.  Headache.  Toothache.  A cough that may get worse at night.  Mucus that collects in the throat or the back of the nose (postnasal drip).  Being unable to smell and taste.  Being very tired (fatigue).  A fever.  Sore throat.  Bad breath. How is this diagnosed? This condition is diagnosed based on:  Your symptoms.  Your medical history.  A physical exam.  Tests to find out if your condition is short-term (acute) or long-term (chronic). Your doctor may: ? Check your nose for growths (polyps). ? Check your sinuses using a tool that has a light (endoscope). ? Check for allergies or germs. ? Do imaging tests, such as an MRI or CT scan. How is this treated? Treatment for this condition  depends on the cause and whether it is short-term or long-term.  If caused by a virus, your symptoms should go away on their own within 10 days. You may be given medicines to relieve symptoms. They include: ? Medicines that shrink swollen tissue in the nose. ? Medicines that treat allergies (antihistamines). ? A spray that treats swelling of the nostrils. ? Rinses that help get rid of thick mucus in your nose (nasal saline washes).  If caused by bacteria, your doctor may wait to see if you will get better without treatment. You may be given antibiotic medicine if you have: ? A very bad infection. ? A weak body defense system.  If caused by growths in the nose, you may need to have surgery. Follow these instructions at home: Medicines  Take, use, or apply over-the-counter and prescription medicines only as told by your doctor. These may include nasal sprays.  If you were prescribed an antibiotic medicine, take it as told by your doctor. Do not stop taking the antibiotic even if you start to feel better. Hydrate and humidify   Drink enough water to keep your pee (urine) pale yellow.  Use a cool mist humidifier to keep the humidity level in your home above 50%.  Breathe in steam for 10-15 minutes, 3-4 times a day, or as told by your doctor. You can do this in the bathroom while a hot shower is running.  Try not to spend time in cool or dry air.   Rest  Rest as much as you can.  Sleep with your head raised (elevated).  Make sure you get enough sleep each night. General instructions   Put a warm, moist washcloth on your face 3-4 times a day, or as often as told by your doctor. This will help with discomfort.  Wash your hands often with soap and water. If there is no soap and water, use hand sanitizer.  Do not smoke. Avoid being around people who are smoking (secondhand smoke).  Keep all follow-up visits as told by your doctor. This is important. Contact a doctor if:  You  have a fever.  Your symptoms get worse.  Your symptoms do not get better within 10 days. Get help right away if:  You have a very bad headache.  You cannot stop throwing up (vomiting).  You have very bad pain or swelling around your face or eyes.  You have trouble seeing.  You feel confused.  Your neck is stiff.  You have trouble breathing. Summary  Sinusitis is swelling of your sinuses. Sinuses are hollow spaces in the bones around your face.  This condition is caused by tissues in your nose that become inflamed or swollen. This traps germs. These can lead to infection.  If you were prescribed an antibiotic medicine, take it as told by your doctor. Do not stop taking it even if you start to feel better.  Keep all follow-up visits as told by your doctor. This is important. This information is not intended to replace advice given to you by your health care provider. Make sure you discuss any questions you have with your health care provider. Document Released: 08/18/2007 Document Revised: 08/01/2017 Document Reviewed: 08/01/2017 Elsevier Interactive Patient Education  2019 [employer].  [employer] Vaporizer A cool mist vaporizer is a device that releases a cool mist into the air. If you have a cough or a cold, using a vaporizer may help relieve your symptoms. The mist adds moisture to the air, which may help thin your mucus and make it less sticky. When your mucus is thin and less sticky, it easier for you to breathe and to cough up secretions. Do not use a vaporizer if you are allergic to mold. Follow these instructions at home:  Follow the instructions that come with the vaporizer.  Do not use anything other than distilled water in the vaporizer.  Do not run the vaporizer all of the time. Doing that can cause mold or bacteria to grow in the vaporizer.  Clean the vaporizer after each time that you use it.  Clean and dry the vaporizer well before storing it.  Stop  using the vaporizer if your breathing symptoms get worse. This information is not intended to replace advice given to you by your health care provider. Make sure you discuss any questions you have with your health care provider. Document Released: 11/27/2003 Document Revised: 09/19/2015 Document Reviewed: 05/31/2015 Elsevier Interactive Patient Education  2019 [employer].

## 2018-07-21 NOTE — Progress Notes (Signed)
Name: Charlotte Thompson   MRN: [id number]    DOB: 21-Jun-1965   Date:07/21/2018       Progress Note  Subjective  Chief Complaint  Chief Complaint  Patient presents with  . GI Problem    vomiting with headache since yesterday morining, when she blows her nose has blood, cough for last 2 months since pneumonia    HPI  Pt presents with concern for sinusitis and nausea/vomiting.  She started with nasal congestion about 8-9 days ago.  Noticed a small amount of blood in her nasal drainage last week, and her sinus congestion and sinus headache have progressively worsened.  Last night she had multiple episodes of vomiting, which she states commonly occur when she has a "severe" sinus infection.  She endorses fatigue and ongoing nausea, but no vomiting today.  Denies cough, sore throat, chest tightness, shortness of breath, fevers.  She was recently treated for CAP and was on Doxycycline and completed this course with improved.   Patient Active Problem List   Diagnosis Date Noted  . Major depressive disorder, recurrent (HCC) 12/06/2017  . 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[facility]) 10/07/2016  . Prolonged grief reaction 09/23/2016  . Aggressive behavior 06/29/2016  . Fatigue 06/29/2016  . Vitamin D deficiency 06/29/2016  . Vitamin B12 deficiency 06/29/2016  . Microalbuminuria 12/27/2015  . Insomnia 12/27/2015  . Anxiety and depression 12/05/2015  . Irritability 06/21/2015  . Hypothyroidism 06/16/2015  . Tobacco abuse 06/16/2015  . Hypercholesterolemia 06/16/2015  . Medication monitoring encounter 06/16/2015  . Family history of chronic renal disease 06/16/2015  . Hx of iron deficiency anemia 06/16/2015  . Heart murmur 06/16/2015  . Benign cyst of skin 10/18/2014  . Hot flashes, menopausal 09/02/2014    Social History   Tobacco Use  . Smoking status: Current Every Day Smoker    Years: 16.00    Types: Cigarettes    Start date: 03/24/2014  . Smokeless tobacco: Never Used   Substance Use Topics  . Alcohol use: No    Alcohol/week: 0.0 standard drinks    Current Outpatient Medications:  .  docusate sodium (COLACE) 50 MG capsule, Take 50 mg by mouth daily as needed for mild constipation., Disp: , Rfl:  .  levothyroxine (SYNTHROID) 112 MCG tablet, Take 1 tablet (112 mcg total) by mouth daily., Disp: 30 tablet, Rfl: 0 .  pravastatin (PRAVACHOL) 80 MG tablet, Take 1 tablet (80 mg total) by mouth at bedtime., Disp: 90 tablet, Rfl: 0 .  venlafaxine XR (EFFEXOR-XR) 75 MG 24 hr capsule, Take 3 capsules (225 mg total) by mouth daily with breakfast., Disp: 90 capsule, Rfl: 5 .  albuterol (PROVENTIL HFA;VENTOLIN HFA) 108 (90 Base) MCG/ACT inhaler, Inhale 2 puffs into the lungs every 6 (six) hours as needed for wheezing or shortness of breath. (Patient not taking: Reported on 07/21/2018), Disp: 1 Inhaler, Rfl: 2 .  amoxicillin-clavulanate (AUGMENTIN) 875-125 MG tablet, Take 1 tablet by mouth 2 (two) times daily for 10 days., Disp: 20 tablet, Rfl: 0 .  fluticasone (FLONASE) 50 MCG/ACT nasal spray, Place 2 sprays into both nostrils daily., Disp: 16 g, Rfl: 6 .  ondansetron (ZOFRAN) 4 MG tablet, Take 1 tablet (4 mg total) by mouth every 8 (eight) hours as needed for nausea or vomiting., Disp: 20 tablet, Rfl: 0 .  QUEtiapine (SEROQUEL) 50 MG tablet, Take 0.5 tablets (25 mg total) by mouth at bedtime. (Patient not taking: Reported on 07/21/2018), Disp: 15 tablet, Rfl: 5  Allergies  Allergen Reactions  .  Aspirin   . Codeine     I personally reviewed active problem list, medication list, allergies, notes from last encounter, lab results with the patient/caregiver today.  ROS  Ten systems reviewed and is negative except as mentioned in HPI.  Objective  Vitals:   07/21/18 0812  BP: 122/82  Pulse: 85  Resp: 14  Temp: 98.1 F (36.7 C)  TempSrc: Oral  SpO2: 99%  Weight: 148 lb (67.1 kg)  Height: 5\' 3"  (1.6 m)   Body mass index is 26.22 kg/m.  Nursing Note and Vital  Signs reviewed.  Physical Exam  Constitutional: Patient appears well-developed and well-nourished. Obese. No distress.  HEENT: head atraumatic, normocephalic, pupils equal and reactive to light, Bilateral TM's without erythema or effusion,  bilateral maxillary and frontal sinuses are tender, neck supple without lymphadenopathy, throat within normal limits - no erythema or exudate, no tonsillar swelling Cardiovascular: Normal rate, regular rhythm and normal heart sounds.  No murmur heard. No BLE edema. Pulmonary/Chest: Effort normal and breath sounds clear bilaterally. No respiratory distress. Abdominal: Soft, bowel sounds normal, there is no tenderness, no HSM Psychiatric: Patient has a normal mood and affect. behavior is normal. Judgment and thought content normal.  Results for orders placed or performed in visit on 07/17/18 (from the past 72 hour(s))  CBC with Differential/Platelet     Status: None   Collection Time: 07/19/18  8:12 AM  Result Value Ref Range   WBC 8.4 3.8 - 10.8 Thousand/uL   RBC 4.13 3.80 - 5.10 Million/uL   Hemoglobin 13.4 11.7 - 15.5 g/dL   HCT 40.938.9 81.135.0 - 91.445.0 %   MCV 94.2 80.0 - 100.0 fL   MCH 32.4 27.0 - 33.0 pg   MCHC 34.4 32.0 - 36.0 g/dL   RDW 78.212.7 95.611.0 - 21.315.0 %   Platelets 268 140 - 400 Thousand/uL   MPV 9.7 7.5 - 12.5 fL   Neutro Abs 5,754 1,500 - 7,800 cells/uL   Lymphs Abs 1,747 850 - 3,900 cells/uL   Absolute Monocytes 647 200 - 950 cells/uL   Eosinophils Absolute 193 15 - 500 cells/uL   Basophils Absolute 59 0 - 200 cells/uL   Neutrophils Relative % 68.5 %   Total Lymphocyte 20.8 %   Monocytes Relative 7.7 %   Eosinophils Relative 2.3 %   Basophils Relative 0.7 %  TSH     Status: None   Collection Time: 07/19/18  8:12 AM  Result Value Ref Range   TSH 4.15 mIU/L    Comment:           Reference Range .           > or = 20 Years  0.40-4.50 .                Pregnancy Ranges           First trimester    0.26-2.66           Second trimester    0.55-2.73           Third trimester    0.43-2.91   VITAMIN D 25 Hydroxy (Vit-D Deficiency, Fractures)     Status: None   Collection Time: 07/19/18  8:12 AM  Result Value Ref Range   Vit D, 25-Hydroxy 36 30 - 100 ng/mL    Comment: Vitamin D Status         25-OH Vitamin D: . Deficiency:                    <  20 ng/mL Insufficiency:             20 - 29 ng/mL Optimal:                 > or = 30 ng/mL . For 25-OH Vitamin D testing on patients on  D2-supplementation and patients for whom quantitation  of D2 and D3 fractions is required, the [employer]ureD(TM) 25-OH VIT D, (D2,D3), LC/MS/MS is recommended: order  code 86381 (patients >73yrs). See Note 1 . Note 1 . For additional information, please refer to  http://education.[facility].com/faq/FAQ199  (This link is being provided for informational/ educational purposes only.)   Lipid panel     Status: Abnormal   Collection Time: 07/19/18  8:12 AM  Result Value Ref Range   Cholesterol 173 <200 mg/dL   HDL 45 (L) > OR = 50 mg/dL   Triglycerides 771 <165 mg/dL   LDL Cholesterol (Calc) 104 (H) mg/dL (calc)    Comment: Reference range: <100 . Desirable range <100 mg/dL for primary prevention;   <70 mg/dL for patients with CHD or diabetic patients  with > or = 2 CHD risk factors. Marland Kitchen LDL-C is now calculated using the Martin-Hopkins  calculation, which is a validated novel method providing  better accuracy than the Friedewald equation in the  estimation of LDL-C.  Horald Pollen et al. Lenox Ahr. 7903;833(38): 2061-2068  (http://education.[facility].com/faq/FAQ164)    Total CHOL/HDL Ratio 3.8 <5.0 (calc)   Non-HDL Cholesterol (Calc) 128 <130 mg/dL (calc)    Comment: For patients with diabetes plus 1 major ASCVD risk  factor, treating to a non-HDL-C goal of <100 mg/dL  (LDL-C of <32 mg/dL) is considered a therapeutic  option.   Hepatic function panel     Status: Abnormal   Collection Time: 07/19/18  8:12 AM  Result Value Ref Range    Total Protein 6.0 (L) 6.1 - 8.1 g/dL   Albumin 4.1 3.6 - 5.1 g/dL   Globulin 1.9 1.9 - 3.7 g/dL (calc)   AG Ratio 2.2 1.0 - 2.5 (calc)   Total Bilirubin 0.3 0.2 - 1.2 mg/dL   Bilirubin, Direct 0.0 0.0 - 0.2 mg/dL    Comment: Verified by repeat analysis. .    Indirect Bilirubin 0.3 0.2 - 1.2 mg/dL (calc)   Alkaline phosphatase (APISO) 60 37 - 153 U/L   AST 14 10 - 35 U/L   ALT 11 6 - 29 U/L    Assessment & Plan  1. Acute non-recurrent pansinusitis - amoxicillin-clavulanate (AUGMENTIN) 875-125 MG tablet; Take 1 tablet by mouth 2 (two) times daily for 10 days.  Dispense: 20 tablet; Refill: 0 - fluticasone (FLONASE) 50 MCG/ACT nasal spray; Place 2 sprays into both nostrils daily.  Dispense: 16 g; Refill: 6  2. Nausea - ondansetron (ZOFRAN) 4 MG tablet; Take 1 tablet (4 mg total) by mouth every 8 (eight) hours as needed for nausea or vomiting.  Dispense: 20 tablet; Refill: 0 - Not taking seroquel currently; did discuss potential for QT prolongation.  -Red flags and when to present for emergency care or RTC including fever >101.19F, chest pain, shortness of breath, new/worsening/un-resolving symptoms, reviewed with patient at time of visit. Follow up and care instructions discussed and provided in AVS.

## 2018-07-22 ENCOUNTER — Encounter: Payer: Self-pay | Admitting: Family Medicine

## 2018-07-23 ENCOUNTER — Encounter: Payer: Self-pay | Admitting: Family Medicine

## 2018-07-24 ENCOUNTER — Other Ambulatory Visit: Payer: Self-pay

## 2018-07-24 ENCOUNTER — Emergency Department: Payer: Self-pay

## 2018-07-24 ENCOUNTER — Encounter: Payer: Self-pay | Admitting: Family Medicine

## 2018-07-24 ENCOUNTER — Emergency Department
Admission: EM | Admit: 2018-07-24 | Discharge: 2018-07-24 | Disposition: A | Discharge status: Home / Self Care | Payer: Self-pay | Attending: Student in an Organized Health Care Education/Training Program | Admitting: Student in an Organized Health Care Education/Training Program

## 2018-07-24 ENCOUNTER — Telehealth: Payer: Self-pay

## 2018-07-24 DIAGNOSIS — R079 Chest pain, unspecified: Secondary | ICD-10-CM | POA: Insufficient documentation

## 2018-07-24 DIAGNOSIS — E785 Hyperlipidemia, unspecified: Secondary | ICD-10-CM | POA: Insufficient documentation

## 2018-07-24 DIAGNOSIS — F17210 Nicotine dependence, cigarettes, uncomplicated: Secondary | ICD-10-CM | POA: Insufficient documentation

## 2018-07-24 DIAGNOSIS — B379 Candidiasis, unspecified: Secondary | ICD-10-CM

## 2018-07-24 LAB — BASIC METABOLIC PANEL
Anion gap: 9 (ref 5–15)
BUN: 13 mg/dL (ref 6–20)
CO2: 25 mmol/L (ref 22–32)
Calcium: 9 mg/dL (ref 8.9–10.3)
Chloride: 104 mmol/L (ref 98–111)
Creatinine, Ser: 0.67 mg/dL (ref 0.44–1.00)
GFR calc Af Amer: >60 mL/min (ref >60)
GFR calc non Af Amer: >60 mL/min (ref >60)
Glucose, Bld: 125 mg/dL — ABNORMAL HIGH (ref 70–99)
Potassium: 3.3 mmol/L — ABNORMAL LOW (ref 3.5–5.1)
Sodium: 138 mmol/L (ref 135–145)

## 2018-07-24 LAB — CBC
HCT: 38.9 % (ref 36.0–46.0)
Hemoglobin: 13.3 g/dL (ref 12.0–15.0)
MCH: 32 pg (ref 26.0–34.0)
MCHC: 34.2 g/dL (ref 30.0–36.0)
MCV: 93.7 fL (ref 80.0–100.0)
Platelets: 266 10*3/uL (ref 150–400)
RBC: 4.15 MIL/uL (ref 3.87–5.11)
RDW: 12.2 % (ref 11.5–15.5)
WBC: 9.1 10*3/uL (ref 4.0–10.5)
nRBC: 0 % (ref 0.0–0.2)

## 2018-07-24 LAB — TROPONIN I: Troponin I: <0.03 ng/mL (ref <0.03)

## 2018-07-24 MED ORDER — LIDOCAINE 5 % EX PTCH
1.0000 | MEDICATED_PATCH | CUTANEOUS | Status: DC
  Administered 2018-07-24: 1 via TRANSDERMAL
  Filled 2018-07-24: qty 1

## 2018-07-24 MED ORDER — OXYCODONE-ACETAMINOPHEN 5-325 MG PO TABS
1.0000 | ORAL_TABLET | Freq: Once | ORAL | Status: AC
  Administered 2018-07-24: 1 via ORAL
  Filled 2018-07-24: qty 1

## 2018-07-24 MED ORDER — ALBUTEROL SULFATE HFA 108 (90 BASE) MCG/ACT IN AERS: 2.0000 | INHALATION_SPRAY | Freq: Four times a day (QID) | RESPIRATORY_TRACT | 2 refills | Status: DC | PRN

## 2018-07-24 MED ORDER — HYDROCODONE-ACETAMINOPHEN 5-325 MG PO TABS: 1.0000 | ORAL_TABLET | ORAL | 0 refills | Status: DC | PRN

## 2018-07-24 MED ORDER — SODIUM CHLORIDE 0.9% FLUSH: 3.0000 mL | Freq: Once | INTRAVENOUS | Status: DC

## 2018-07-24 MED ORDER — IOHEXOL 350 MG/ML SOLN
75.0000 mL | Freq: Once | INTRAVENOUS | Status: AC | PRN
  Administered 2018-07-24: 75 mL via INTRAVENOUS

## 2018-07-24 MED ORDER — PREDNISONE 20 MG PO TABS: 40.0000 mg | ORAL_TABLET | Freq: Every day | ORAL | 0 refills | Status: AC

## 2018-07-24 NOTE — ED Triage Notes (Signed)
Pt c/o substernal to left sided chest pain with Nausea, SOB and fatigue since last Monday, worsened today. Pt is in NAD on arrival..

## 2018-07-24 NOTE — ED Provider Notes (Signed)
[facility] Emergency Department Provider Note    First MD Initiated Contact with Patient 07/24/18 1542     (approximate)  I have reviewed the triage vital signs and the nursing notes.   HISTORY  Chief Complaint Chest Pain    HPI Charlotte Thompson is a 54 y.o. female plosive past medical history presents the ER for evaluation of left upper back pain started early this morning.  States she is also been having several months of generalized fatigue and weakness.  Denies any anterior chest pain nausea vomiting diaphoresis.  No exertional dyspnea.  Does have pain when taking deep inspiration.  Denies any trauma.  No dysuria or hematuria.  States the pain is mild to moderate.    Past Medical History:  Diagnosis Date  . GERD (gastroesophageal reflux disease)   . Hemorrhoid   . Hyperlipidemia   . Pneumonia    Family History  Problem Relation Age of Onset  . Autoimmune disease Father   . Kidney failure Father   . Heart disease Father   . Heart attack Father   . Hypertension Father   . Hyperlipidemia Father   . Clotting disorder Mother   . Kidney disease Mother   . Heart attack Mother   . Thyroid disease Sister   . Cancer Sister        breast  . Breast cancer Sister 16  . Thyroid disease Daughter   . Diabetes Paternal Grandmother   . Diabetes type II Paternal Grandmother   . Hypertension Daughter   . Hearing loss Maternal Grandmother   . Cancer Maternal Grandmother        colon  . Cancer Maternal Grandfather        prostate  . Pneumonia Paternal Grandfather   . Cancer Paternal Grandfather        bone   Past Surgical History:  Procedure Laterality Date  . COLONOSCOPY    . PARTIAL HYSTERECTOMY  2010   Patient Active Problem List   Diagnosis Date Noted  . Major depressive disorder, recurrent (HCC) 12/06/2017  . 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 Prolonged grief reaction 09/23/2016  . Aggressive behavior  06/29/2016  . Fatigue 06/29/2016  . Vitamin D deficiency 06/29/2016  . Vitamin B12 deficiency 06/29/2016  . Microalbuminuria 12/27/2015  . Insomnia 12/27/2015  . Anxiety and depression 12/05/2015  . Irritability 06/21/2015  . Hypothyroidism 06/16/2015  . Tobacco abuse 06/16/2015  . Hypercholesterolemia 06/16/2015  . Medication monitoring encounter 06/16/2015  . Family history of chronic renal disease 06/16/2015  . Hx of iron deficiency anemia 06/16/2015  . Heart murmur 06/16/2015  . Benign cyst of skin 10/18/2014  . Hot flashes, menopausal 09/02/2014      Prior to Admission medications   Medication Sig Start Date End Date Taking? Authorizing Provider  albuterol (PROVENTIL HFA;VENTOLIN HFA) 108 (90 Base) MCG/ACT inhaler Inhale 2 puffs into the lungs every 6 (six) hours as needed for wheezing or shortness of breath. Patient not taking: Reported on 07/21/2018 06/06/18   Doren Custard, FNP  albuterol (VENTOLIN HFA) 108 (90 Base) MCG/ACT inhaler Inhale 2 puffs into the lungs every 6 (six) hours as needed for wheezing or shortness of breath. 07/24/18   Willy Eddy, MD  amoxicillin-clavulanate (AUGMENTIN) 875-125 MG tablet Take 1 tablet by mouth 2 (two) times daily for 10 days. 07/21/18 07/31/18  Doren Custard, FNP  docusate sodium (COLACE) 50 MG capsule Take 50 mg by mouth daily as needed  for mild constipation.    [provider]  fluticasone (FLONASE) 50 MCG/ACT nasal spray Place 2 sprays into both nostrils daily. 07/21/18   Doren Custard, FNP  HYDROcodone-acetaminophen (NORCO) 5-325 MG tablet Take 1 tablet by mouth every 4 (four) hours as needed for moderate pain. 07/24/18   Willy Eddy, MD  levothyroxine (SYNTHROID) 112 MCG tablet Take 1 tablet (112 mcg total) by mouth daily. 07/17/18   Doren Custard, FNP  ondansetron (ZOFRAN) 4 MG tablet Take 1 tablet (4 mg total) by mouth every 8 (eight) hours as needed for nausea or vomiting. 07/21/18   Doren Custard, FNP  pravastatin  (PRAVACHOL) 80 MG tablet Take 1 tablet (80 mg total) by mouth at bedtime. 06/16/18   Lada, Janit Bern, MD  predniSONE (DELTASONE) 20 MG tablet Take 2 tablets (40 mg total) by mouth daily for 5 days. 07/24/18 07/29/18  Willy Eddy, MD  QUEtiapine (SEROQUEL) 50 MG tablet Take 0.5 tablets (25 mg total) by mouth at bedtime. Patient not taking: Reported on 07/21/2018 04/20/18   Kerman Passey, MD  venlafaxine XR (EFFEXOR-XR) 75 MG 24 hr capsule Take 3 capsules (225 mg total) by mouth daily with breakfast. 04/20/18   Lada, Janit Bern, MD    Allergies Aspirin and Codeine    Social History Social History   Tobacco Use  . Smoking status: Current Every Day Smoker    Years: 16.00    Types: Cigarettes    Start date: 03/24/2014  . Smokeless tobacco: Never Used  Substance Use Topics  . Alcohol use: No    Alcohol/week: 0.0 standard drinks  . Drug use: No    Review of Systems Patient denies headaches, rhinorrhea, blurry vision, numbness, shortness of breath, chest pain, edema, cough, abdominal pain, nausea, vomiting, diarrhea, dysuria, fevers, rashes or hallucinations unless otherwise stated above in HPI. ____________________________________________   PHYSICAL EXAM:  VITAL SIGNS: Vitals:   07/24/18 1610 07/24/18 1639  BP:    Pulse: 70 84  Resp:    Temp:    SpO2: 100% 94%    Constitutional: Alert and oriented.  Eyes: Conjunctivae are normal.  Head: Atraumatic. Nose: No congestion/rhinnorhea. Mouth/Throat: Mucous membranes are moist.   Neck: No stridor. Painless ROM.  Cardiovascular: Normal rate, regular rhythm. Grossly normal heart sounds.  Good peripheral circulation. Respiratory: Normal respiratory effort.  No retractions. Lungs CTAB. Gastrointestinal: Soft and nontender. No distention. No abdominal bruits. No CVA tenderness. Genitourinary:  Musculoskeletal: ttp of inferior left trap muscle. No lower extremity tenderness nor edema.  No joint effusions. Neurologic:  Normal speech and  language. No gross focal neurologic deficits are appreciated. No facial droop Skin:  Skin is warm, dry and intact. No rash noted. Psychiatric: Mood and affect are normal. Speech and behavior are normal.  ____________________________________________   LABS (all labs ordered are listed, but only abnormal results are displayed)  Results for orders placed or performed during the hospital encounter of 07/24/18 (from the past 24 hour(s))  Basic metabolic panel     Status: Abnormal   Collection Time: 07/24/18  1:58 PM  Result Value Ref Range   Sodium 138 135 - 145 mmol/L   Potassium 3.3 (L) 3.5 - 5.1 mmol/L   Chloride 104 98 - 111 mmol/L   CO2 25 22 - 32 mmol/L   Glucose, Bld 125 (H) 70 - 99 mg/dL   BUN 13 6 - 20 mg/dL   Creatinine, Ser 9.02 0.44 - 1.00 mg/dL   Calcium 9.0 8.9 - 10.3  mg/dL   GFR calc non Af Amer >60 >60 mL/min   GFR calc Af Amer >60 >60 mL/min   Anion gap 9 5 - 15  CBC     Status: None   Collection Time: 07/24/18  1:58 PM  Result Value Ref Range   WBC 9.1 4.0 - 10.5 K/uL   RBC 4.15 3.87 - 5.11 MIL/uL   Hemoglobin 13.3 12.0 - 15.0 g/dL   HCT 16.1 09.6 - 04.5 %   MCV 93.7 80.0 - 100.0 fL   MCH 32.0 26.0 - 34.0 pg   MCHC 34.2 30.0 - 36.0 g/dL   RDW 40.9 81.1 - 91.4 %   Platelets 266 150 - 400 K/uL   nRBC 0.0 0.0 - 0.2 %  Troponin I - ONCE - STAT     Status: None   Collection Time: 07/24/18  1:58 PM  Result Value Ref Range   Troponin I <0.03 <0.03 ng/mL   ____________________________________________  EKG My review and personal interpretation at Time: 13:42   Indication: back pain  Rate: 80  Rhythm: sinus Axis: normal Other: normal intervals, no stemi ____________________________________________  RADIOLOGY  I personally reviewed all radiographic images ordered to evaluate for the above acute complaints and reviewed radiology reports and findings.  These findings were personally discussed with the patient.  Please see medical record for radiology report.    ____________________________________________   PROCEDURES  Procedure(s) performed:  Procedures    Critical Care performed: no ____________________________________________   INITIAL IMPRESSION / ASSESSMENT AND PLAN / ED COURSE  Pertinent labs & imaging results that were available during my care of the patient were reviewed by me and considered in my medical decision making (see chart for details).   DDX: ACS, pericarditis, esophagitis, boerhaaves, pe, dissection, pna, bronchitis, costochondritis   KAMIYAH KINDEL is a 53 y.o. who presents to the ED with left upper back pain as described above.  Is reproducible with palpation.  EKG shows no acute ischemia and symptoms are atypical for ACS.  Low risk heart score.  Not consistent with ACS given negative troponin.  Blood work is reassuring however given the patient's pain with pleuritic chest discomfort will order CT imaging due to concern for pneumonia, mass, PE.    Clinical Course as of Jul 24 1650  Mon Jul 24, 2018  1650 CT imaging is reassuring.  Patient currently pain-free.  No evidence of PE.  Patient does endorse smoking may be having component of bronchitis will treat for the.  Patient with good outpatient follow-up.  Discussed signs and symptoms for which she should return to the ER.  Have discussed with the patient and available family all diagnostics and treatments performed thus far and all questions were answered to the best of my ability. The patient demonstrates understanding and agreement with plan.    [PR]    Clinical Course User Index [PR] Willy Eddy, MD    The patient was evaluated in Emergency Department today for the symptoms described in the history of present illness. He/she was evaluated in the context of the global COVID-19 pandemic, which necessitated consideration that the patient might be at risk for infection with the SARS-CoV-2 virus that causes COVID-19. Institutional protocols and algorithms that  pertain to the evaluation of patients at risk for COVID-19 are in a state of rapid change based on information released by regulatory bodies including the CDC and federal and state organizations. These policies and algorithms were followed during the patient's care in the ED.  As part of my medical decision making, I reviewed the following data within the electronic MEDICAL RECORD NUMBER Nursing notes reviewed and incorporated, Labs reviewed, notes from prior ED visits and [place] Controlled Substance Database   ____________________________________________   FINAL CLINICAL IMPRESSION(S) / ED DIAGNOSES  Final diagnoses:  Chest pain, unspecified type      NEW MEDICATIONS STARTED DURING THIS VISIT:  New Prescriptions   ALBUTEROL (VENTOLIN HFA) 108 (90 BASE) MCG/ACT INHALER    Inhale 2 puffs into the lungs every 6 (six) hours as needed for wheezing or shortness of breath.   HYDROCODONE-ACETAMINOPHEN (NORCO) 5-325 MG TABLET    Take 1 tablet by mouth every 4 (four) hours as needed for moderate pain.   PREDNISONE (DELTASONE) 20 MG TABLET    Take 2 tablets (40 mg total) by mouth daily for 5 days.     Note:  This document was prepared using Dragon voice recognition software and may include unintentional dictation errors.    Willy Eddyobinson, Derya Dettmann, MD 07/24/18 [phone]

## 2018-07-24 NOTE — Telephone Encounter (Signed)
Copied from CRM [id number]. Topic: General - Other >> Jul 24, 2018  8:58 AM Dalphine Handing A wrote: Reason for CRM: Patient called to say that she is still feeling off balance and needs an extension on her work note for her employer. Patient would like a callback from [place]. Patient stated that she needs to get this information to her employer before 10am today.

## 2018-07-24 NOTE — ED Notes (Signed)

## 2018-07-24 NOTE — Telephone Encounter (Signed)
Due to her reported symptoms, I recommend [facility] or ER for further evaluation

## 2018-07-24 NOTE — Telephone Encounter (Signed)
Patient notified

## 2018-07-25 ENCOUNTER — Encounter: Payer: Self-pay | Admitting: Emergency Medicine

## 2018-07-25 MED ORDER — FLUCONAZOLE 150 MG PO TABS: 150.0000 mg | ORAL_TABLET | Freq: Once | ORAL | 0 refills | Status: AC

## 2018-08-04 ENCOUNTER — Ambulatory Visit: Payer: Self-pay | Admitting: Nurse Practitioner

## 2018-08-15 ENCOUNTER — Other Ambulatory Visit: Payer: Self-pay | Admitting: Family Medicine

## 2018-09-14 ENCOUNTER — Other Ambulatory Visit: Payer: Self-pay | Admitting: Family Medicine

## 2018-09-14 NOTE — Telephone Encounter (Signed)
Please set up routine visit in 1-2 months

## 2018-10-09 ENCOUNTER — Other Ambulatory Visit: Payer: Self-pay | Admitting: Family Medicine

## 2018-10-24 ENCOUNTER — Other Ambulatory Visit: Payer: Self-pay

## 2018-10-24 MED ORDER — PRAVASTATIN SODIUM 80 MG PO TABS: 80.0000 mg | ORAL_TABLET | Freq: Every day | ORAL | 3 refills | Status: DC

## 2018-10-24 NOTE — Telephone Encounter (Signed)
Copied from [place] [id number]. Topic: Appointment Scheduling - Scheduling Inquiry for Clinic >> Oct 24, 2018 10:49 AM Alanda Slim E wrote: Reason for CRM: Pt called to cancel appt due to not having funds to cover appt. Pt needs refill on cholesterol meds but can not afford appt cost/ Pt works until 10pm so if you call please leave a message /please advise

## 2018-10-25 ENCOUNTER — Ambulatory Visit: Payer: Self-pay | Admitting: Nurse Practitioner

## 2018-11-13 ENCOUNTER — Other Ambulatory Visit: Payer: Self-pay | Admitting: Nurse Practitioner

## 2018-11-13 NOTE — Telephone Encounter (Signed)
Please schedule routine appointment in 3 months  

## 2018-11-13 NOTE — Telephone Encounter (Signed)
lvm informing that script has been sent to pharmacy and for her to schedule appt in 3 months

## 2018-12-14 ENCOUNTER — Telehealth: Payer: Self-pay

## 2018-12-15 MED ORDER — PRAVASTATIN SODIUM 80 MG PO TABS: 80.0000 mg | ORAL_TABLET | Freq: Every day | ORAL | 0 refills | Status: DC

## 2018-12-15 MED ORDER — VENLAFAXINE HCL ER 75 MG PO CP24: 225.0000 mg | ORAL_CAPSULE | Freq: Every day | ORAL | 0 refills | Status: DC

## 2018-12-15 NOTE — Telephone Encounter (Signed)
lvm for scheduling °

## 2018-12-15 NOTE — Telephone Encounter (Signed)
Pt needs appt

## 2019-02-06 ENCOUNTER — Other Ambulatory Visit: Payer: Self-pay | Admitting: Family Medicine

## 2019-02-06 NOTE — Telephone Encounter (Signed)
Requested medication (s) are due for refill today: yes  Requested medication (s) are on the active medication list: yes  Last refill:  02/04/2019  Future visit scheduled: no  Notes to clinic:  Review for refill Overdue for follow up   Requested Prescriptions  Pending Prescriptions Disp Refills   levothyroxine (SYNTHROID) 112 MCG tablet [Pharmacy Med Name: Levothyroxine Sodium 15mcg Tablet] 90 tablet 0    Sig: Take 1 tablet by mouth daily.     Endocrinology:  Hypothyroid Agents Failed - 02/06/2019  5:22 AM      Failed - TSH needs to be rechecked within 3 months after an abnormal result. Refill until TSH is due.      Passed - TSH in normal range and within 360 days    TSH  Date Value Ref Range Status  07/19/2018 4.15 mIU/L Final    Comment:              Reference Range .           > or = 20 Years  0.40-4.50 .                Pregnancy Ranges           First trimester    0.26-2.66           Second trimester   0.55-2.73           Third trimester    0.43-2.91          Passed - Valid encounter within last 12 months    Recent Outpatient Visits          6 months ago Acute non-recurrent pansinusitis   [place], FNP   7 months ago Respiratory illness   [place], FNP   8 months ago Respiratory illness   [place], FNP   8 months ago Respiratory illness   [place], FNP   1 year ago Moderate episode of recurrent major depressive disorder [facility])   Arco, Satira Anis, MD

## 2019-02-12 NOTE — Telephone Encounter (Signed)
Tried calling pt and got continuance busy signals

## 2019-02-13 MED ORDER — VENLAFAXINE HCL ER 75 MG PO CP24: 225.0000 mg | ORAL_CAPSULE | Freq: Every day | ORAL | 0 refills | Status: DC

## 2019-02-13 NOTE — Addendum Note (Signed)
Addended by: Hubbard Hartshorn on: 02/13/2019 09:05 PM   Modules accepted: Orders

## 2019-02-13 NOTE — Addendum Note (Signed)
Addended by: Docia Furl on: 02/13/2019 09:12 AM   Modules accepted: Orders

## 2019-02-13 NOTE — Telephone Encounter (Addendum)
Pt has an appt on Monday 02-19-2019 and would like a refill on effexor . [facility] [address] in [place]

## 2019-02-19 ENCOUNTER — Ambulatory Visit: Payer: 59 | Admitting: Family Medicine

## 2019-02-19 ENCOUNTER — Encounter: Payer: Self-pay | Admitting: Family Medicine

## 2019-02-19 ENCOUNTER — Other Ambulatory Visit: Payer: Self-pay

## 2019-02-19 VITALS — BP 134/82 | HR 97 | Temp 96.8°F | Resp 16 | Ht 63.0 in | Wt 143.7 lb

## 2019-02-19 DIAGNOSIS — F331 Major depressive disorder, recurrent, moderate: Secondary | ICD-10-CM | POA: Diagnosis not present

## 2019-02-19 DIAGNOSIS — E038 Other specified hypothyroidism: Secondary | ICD-10-CM | POA: Diagnosis not present

## 2019-02-19 DIAGNOSIS — R739 Hyperglycemia, unspecified: Secondary | ICD-10-CM

## 2019-02-19 DIAGNOSIS — Z23 Encounter for immunization: Secondary | ICD-10-CM | POA: Diagnosis not present

## 2019-02-19 DIAGNOSIS — Z5181 Encounter for therapeutic drug level monitoring: Secondary | ICD-10-CM

## 2019-02-19 DIAGNOSIS — E7800 Pure hypercholesterolemia, unspecified: Secondary | ICD-10-CM

## 2019-02-19 DIAGNOSIS — E876 Hypokalemia: Secondary | ICD-10-CM

## 2019-02-19 DIAGNOSIS — Z862 Personal history of diseases of the blood and blood-forming organs and certain disorders involving the immune mechanism: Secondary | ICD-10-CM

## 2019-02-19 DIAGNOSIS — Z720 Tobacco use: Secondary | ICD-10-CM

## 2019-02-19 DIAGNOSIS — F419 Anxiety disorder, unspecified: Secondary | ICD-10-CM | POA: Diagnosis not present

## 2019-02-19 DIAGNOSIS — F605 Obsessive-compulsive personality disorder: Secondary | ICD-10-CM | POA: Diagnosis not present

## 2019-02-19 MED ORDER — LEVOTHYROXINE SODIUM 112 MCG PO TABS: 112.0000 ug | ORAL_TABLET | Freq: Every day | ORAL | 3 refills | Status: DC

## 2019-02-19 MED ORDER — QUETIAPINE FUMARATE 50 MG PO TABS: ORAL_TABLET | ORAL | 0 refills | Status: DC

## 2019-02-19 MED ORDER — VENLAFAXINE HCL ER 75 MG PO CP24: 225.0000 mg | ORAL_CAPSULE | Freq: Every day | ORAL | 1 refills | Status: DC

## 2019-02-19 MED ORDER — PRAVASTATIN SODIUM 80 MG PO TABS: 80.0000 mg | ORAL_TABLET | Freq: Every day | ORAL | 3 refills | Status: DC

## 2019-02-19 NOTE — Progress Notes (Signed)
Name: Charlotte Thompson   MRN: [id number]    DOB: 1965-10-27   Date:02/19/2019       Progress Note  Chief Complaint  Patient presents with  . Medication Refill  . Depression    States went she took the Effexor 75 mg (three a day) 225 mg and Seroquel 75 mg(1 time a day) and it was a good mix. Would like to discuss this dosage again  . Hypothyroidism    Dry Skin  . Hyperlipidemia  . Anxiety  . Insomnia     Subjective:   Charlotte Thompson is a 53 y.o. female, presents to clinic for routine follow up on the conditions listed above.   Pt concerned with some med changes that were made at multiple visits at this clinic with other providers.  Med changes regarding MDD/mood disorder/anxiety.  Effexor 225 mg dose not helping with depression when she takes it alone.  She was on seroquel 50 mg with Effexor and that did control her sx and depression.  She would like to get back on that combo. Has pmhx of obsessive compulsive personality, MDD, PTSD, past aggressive behavior. Depression screen [facility] 2/9 02/19/2019 07/21/2018 06/12/2018  Decreased Interest 3 0 0  Down, Depressed, Hopeless 3 0 0  PHQ - 2 Score 6 0 0  Altered sleeping 3 0 0  Tired, decreased energy 2 0 3  Change in appetite 2 0 0  Feeling bad or failure about yourself  3 0 0  Trouble concentrating 1 0 0  Moving slowly or fidgety/restless 0 0 0  Suicidal thoughts 1 0 0  PHQ-9 Score 18 0 3  Difficult doing work/chores Somewhat difficult Not difficult at all Not difficult at all  Some recent data might be hidden  mood is much worse lately.  She's not sleeping, that makes her more moody, tired, agitated - she is also out of thyroid meds that additionally makes her feel worse.   Her anxiety is also worse GAD 7 : Generalized Anxiety Score 02/19/2019  Nervous, Anxious, on Edge 2  Control/stop worrying 3  Worry too much - different things 2  Trouble relaxing 3  Restless 0  Easily annoyed or irritable 3  Afraid - awful might happen 2   Total GAD 7 Score 15  Anxiety Difficulty Somewhat difficult    Hyperlipidemia: Current Medication Regimen:  Pravastatin 80 mg daily, overall good compliance Last Lipids: Lab Results  Component Value Date   CHOL 173 07/19/2018   HDL 45 (L) 07/19/2018   LDLCALC 104 (H) 07/19/2018   TRIG 141 07/19/2018   CHOLHDL 3.8 07/19/2018   - Current Diet:  Overall okay diet - Denies: Chest pain, shortness of breath, myalgias. - Documented aortic atherosclerosis? No - Risk factors for atherosclerosis: hypercholesterolemia, hypertension and smoking  Hypothyroidism: Current Medication Regimen: 112 mcg synthroid Takes medicine - but has been out for a little while  - or not sure if she needs dose change Current Symptoms: dry skin, fatigue, mood Denies weight changes, heat/cold intolerance, bowel chnages or CVS symptoms Most recent results are below; we will be repeating labs today. Lab Results  Component Value Date   TSH 4.15 07/19/2018      Patient Active Problem List   Diagnosis Date Noted  . Major depressive disorder, recurrent (HCC) 12/06/2017  . 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[facility]) 10/07/2016  . Prolonged grief reaction 09/23/2016  . Aggressive behavior 06/29/2016  . Fatigue 06/29/2016  . Vitamin D deficiency 06/29/2016  .  Vitamin B12 deficiency 06/29/2016  . Microalbuminuria 12/27/2015  . Insomnia 12/27/2015  . Anxiety and depression 12/05/2015  . Irritability 06/21/2015  . Hypothyroidism 06/16/2015  . Tobacco abuse 06/16/2015  . Hypercholesterolemia 06/16/2015  . Medication monitoring encounter 06/16/2015  . Family history of chronic renal disease 06/16/2015  . Hx of iron deficiency anemia 06/16/2015  . Heart murmur 06/16/2015  . Benign cyst of skin 10/18/2014  . Hot flashes, menopausal 09/02/2014    Past Surgical History:  Procedure Laterality Date  . COLONOSCOPY    . PARTIAL HYSTERECTOMY  2010    Family History  Problem Relation Age of Onset   . Autoimmune disease Father   . Kidney failure Father   . Heart disease Father   . Heart attack Father   . Hypertension Father   . Hyperlipidemia Father   . Clotting disorder Mother   . Kidney disease Mother   . Heart attack Mother   . Thyroid disease Sister   . Cancer Sister        breast  . Breast cancer Sister 45  . Thyroid disease Daughter   . Diabetes Paternal Grandmother   . Diabetes type II Paternal Grandmother   . Hypertension Daughter   . Hearing loss Maternal Grandmother   . Cancer Maternal Grandmother        colon  . Cancer Maternal Grandfather        prostate  . Pneumonia Paternal Grandfather   . Cancer Paternal Grandfather        bone    Social History   Socioeconomic History  . Marital status: Married    Spouse name: [name]  . Number of children: Not on file  . Years of education: Not on file  . Highest education level: Not on file  Occupational History  . Not on file  Social Needs  . Financial resource strain: Not on file  . Food insecurity    Worry: Not on file    Inability: Not on file  . Transportation needs    Medical: Not on file    Non-medical: Not on file  Tobacco Use  . Smoking status: Current Every Day Smoker    Years: 16.00    Types: Cigarettes    Start date: 03/24/2014  . Smokeless tobacco: Never Used  Substance and Sexual Activity  . Alcohol use: No    Alcohol/week: 0.0 standard drinks  . Drug use: No  . Sexual activity: Not Currently  Lifestyle  . Physical activity    Days per week: 0 days    Minutes per session: 0 min  . Stress: Not at all  Relationships  . Social Musician on phone: Patient refused    Gets together: Patient refused    Attends religious service: Patient refused    Active member of club or organization: Patient refused    Attends meetings of clubs or organizations: Patient refused    Relationship status: Married  . Intimate partner violence    Fear of current or ex partner: No    Emotionally  abused: No    Physically abused: No    Forced sexual activity: No  Other Topics Concern  . Not on file  Social History Narrative  . Not on file     Current Outpatient Medications:  .  albuterol (VENTOLIN HFA) 108 (90 Base) MCG/ACT inhaler, Inhale 2 puffs into the lungs every 6 (six) hours as needed for wheezing or shortness of breath., Disp: 1 Inhaler, Rfl:  2 .  docusate sodium (COLACE) 50 MG capsule, Take 50 mg by mouth daily as needed for mild constipation., Disp: , Rfl:  .  fluticasone (FLONASE) 50 MCG/ACT nasal spray, Place 2 sprays into both nostrils daily., Disp: 16 g, Rfl: 6 .  levothyroxine (SYNTHROID) 112 MCG tablet, Take 1 tablet by mouth daily., Disp: 30 tablet, Rfl: 0 .  pravastatin (PRAVACHOL) 80 MG tablet, Take 1 tablet (80 mg total) by mouth at bedtime., Disp: 30 tablet, Rfl: 0 .  venlafaxine XR (EFFEXOR-XR) 75 MG 24 hr capsule, Take 3 capsules (225 mg total) by mouth daily with breakfast., Disp: 30 capsule, Rfl: 0 .  albuterol (PROVENTIL HFA;VENTOLIN HFA) 108 (90 Base) MCG/ACT inhaler, Inhale 2 puffs into the lungs every 6 (six) hours as needed for wheezing or shortness of breath. (Patient not taking: Reported on 02/19/2019), Disp: 1 Inhaler, Rfl: 2 .  HYDROcodone-acetaminophen (NORCO) 5-325 MG tablet, Take 1 tablet by mouth every 4 (four) hours as needed for moderate pain. (Patient not taking: Reported on 02/19/2019), Disp: 6 tablet, Rfl: 0 .  ondansetron (ZOFRAN) 4 MG tablet, Take 1 tablet (4 mg total) by mouth every 8 (eight) hours as needed for nausea or vomiting. (Patient not taking: Reported on 02/19/2019), Disp: 20 tablet, Rfl: 0 .  QUEtiapine (SEROQUEL) 50 MG tablet, Take 0.5 tablets (25 mg total) by mouth at bedtime. (Patient not taking: Reported on 02/19/2019), Disp: 15 tablet, Rfl: 5  Allergies  Allergen Reactions  . Aspirin Nausea And Vomiting  . Codeine Hives    I personally reviewed active problem list, medication list, allergies, family history, social history,  health maintenance, notes from last encounter, lab results, imaging with the patient/caregiver today.  Review of Systems  Constitutional: Negative.   HENT: Negative.   Eyes: Negative.   Respiratory: Negative.   Cardiovascular: Negative.   Gastrointestinal: Negative.   Endocrine: Negative.   Genitourinary: Negative.   Musculoskeletal: Negative.   Skin: Negative.   Allergic/Immunologic: Negative.   Neurological: Negative.   Hematological: Negative.   Psychiatric/Behavioral: Negative.   All other systems reviewed and are negative.    Objective:    Vitals:   02/19/19 1122  BP: 134/82  Pulse: 97  Resp: 16  Temp: (!) 96.8 F (36 C)  TempSrc: Temporal  SpO2: 99%  Weight: 143 lb 11.2 oz (65.2 kg)  Height: 5\' 3"  (1.6 m)    Body mass index is 25.46 kg/m.  Physical Exam Vitals and nursing note reviewed.  Constitutional:      General: She is not in acute distress.    Appearance: Normal appearance. She is well-developed. She is not ill-appearing, toxic-appearing or diaphoretic.     Interventions: Face mask in place.  HENT:     Head: Normocephalic and atraumatic.     Right Ear: External ear normal.     Left Ear: External ear normal.  Eyes:     General: Lids are normal. No scleral icterus.       Right eye: No discharge.        Left eye: No discharge.     Conjunctiva/sclera: Conjunctivae normal.  Neck:     Trachea: Phonation normal. No tracheal deviation.  Cardiovascular:     Rate and Rhythm: Normal rate and regular rhythm.     Pulses: Normal pulses.          Radial pulses are 2+ on the right side and 2+ on the left side.       Posterior tibial pulses are 2+ on  the right side and 2+ on the left side.     Heart sounds: Normal heart sounds. No murmur. No friction rub. No gallop.   Pulmonary:     Effort: Pulmonary effort is normal. No respiratory distress.     Breath sounds: Normal breath sounds. No stridor. No wheezing, rhonchi or rales.  Chest:     Chest wall: No  tenderness.  Abdominal:     General: Bowel sounds are normal. There is no distension.     Palpations: Abdomen is soft.     Tenderness: There is no abdominal tenderness. There is no guarding or rebound.  Musculoskeletal:        General: No deformity. Normal range of motion.     Cervical back: Normal range of motion and neck supple.     Right lower leg: No edema.     Left lower leg: No edema.  Lymphadenopathy:     Cervical: No cervical adenopathy.  Skin:    General: Skin is warm and dry.     Capillary Refill: Capillary refill takes less than 2 seconds.     Coloration: Skin is not jaundiced or pale.     Findings: No rash.  Neurological:     Mental Status: She is alert and oriented to person, place, and time.     Motor: No abnormal muscle tone.     Gait: Gait normal.  Psychiatric:        Speech: Speech normal.        Behavior: Behavior normal.      No results found for this or any previous visit (from the past 2160 hour(s)).    PHQ2/9: [facility] 2[facility] 02/19/2019 07/21/2018 06/12/2018 06/09/2018 12/06/2017  Decreased Interest 3 0 0 0 1  Down, Depressed, Hopeless 3 0 0 0 3  PHQ - 2 Score 6 0 0 0 4  Altered sleeping 3 0 0 0 3  Tired, decreased energy 2 0 3 3 3   Change in appetite 2 0 0 0 3  Feeling bad or failure about yourself  3 0 0 0 3  Trouble concentrating 1 0 0 0 3  Moving slowly or fidgety/restless 0 0 0 0 0  Suicidal thoughts 1 0 0 0 0  PHQ-9 Score 18 0 3 3 19   Difficult doing work/chores Somewhat difficult Not difficult at all Not difficult at all Not difficult at all Extremely dIfficult  Some recent data might be hidden    phq 9 is positive Addressed in HPI and A&P  Fall Risk: Fall Risk  02/19/2019 07/21/2018 06/12/2018 06/09/2018 12/06/2017  Falls in the past year? 0 0 0 0 No  Number falls in past yr: 0 0 0 0 -  Injury with Fall? 0 0 0 0 -  Follow up - - Falls evaluation completed - -    Functional Status Survey: Is the patient deaf or have difficulty  hearing?: No Does the patient have difficulty seeing, even when wearing glasses/contacts?: Yes Does the patient have difficulty concentrating, remembering, or making decisions?: No Does the patient have difficulty walking or climbing stairs?: No Does the patient have difficulty dressing or bathing?: No Does the patient have difficulty doing errands alone such as visiting a doctor's office or shopping?: Yes    Assessment & Plan:   . 1. Moderate episode of recurrent major depressive disorder (HCC) PHQ and GAD score very high, pt states she felt better on past tx with seroquel and effexor - We looked up medications, SE,  interactions and agreed to restart seroquel, but at lower dose with slow titration - hope it will help with her insomnia, anxiety, agitation, depression.  Pt very reasonable.  1 month f/up to recheck. - QUEtiapine (SEROQUEL) 50 MG tablet; Take 1/2 tab po at bedtime x 2 week, then take 1 tab 50 mg po at bedtime daily  Dispense: 45 tablet; Refill: 0 - venlafaxine XR (EFFEXOR-XR) 75 MG 24 hr capsule; Take 3 capsules (225 mg total) by mouth daily with breakfast.  Dispense: 270 capsule; Refill: 1  2. 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[facility]) See above - if still having severe sx, will need psychiatry to eval and manage - QUEtiapine (SEROQUEL) 50 MG tablet; Take 1/2 tab po at bedtime x 2 week, then take 1 tab 50 mg po at bedtime daily  Dispense: 45 tablet; Refill: 0 - venlafaxine XR (EFFEXOR-XR) 75 MG 24 hr capsule; Take 3 capsules (225 mg total) by mouth daily with breakfast.  Dispense: 270 capsule; Refill: 1  3. Anxiety disorder, unspecified type See above - QUEtiapine (SEROQUEL) 50 MG tablet; Take 1/2 tab po at bedtime x 2 week, then take 1 tab 50 mg po at bedtime daily  Dispense: 45 tablet; Refill: 0 - venlafaxine XR (EFFEXOR-XR) 75 MG 24 hr capsule; Take 3 capsules (225 mg total) by mouth daily with breakfast.  Dispense: 270 capsule; Refill: 1  4. Other specified  hypothyroidism Recheck with fatigue, moods, depression, may be chemical hypothyroid?   - TSH - levothyroxine (SYNTHROID) 112 MCG tablet; Take 1 tablet (112 mcg total) by mouth daily.  Dispense: 90 tablet; Refill: 3  5. Hypercholesterolemia Compliant with meds, no SE, no myalgias, fatigue or jaundice - Lipid Panel - CMP w GFR - pravastatin (PRAVACHOL) 80 MG tablet; Take 1 tablet (80 mg total) by mouth at bedtime.  Dispense: 90 tablet; Refill: 3  6. Hypokalemia Recent labs showed - recheck - CMP w GFR  7. Hyperglycemia Sugars high, check labs -  - CMP w GFR - A1C  8. Medication monitoring encounter - Lipid Panel - TSH - CMP w GFR - CBC w/ Diff  9. Hx of iron deficiency anemia With fatigue r/o anemia - CBC w/ Diff  10. Need for immunization against influenza Done today - Flu Vaccine QUAD 36+ mos IM  11. Tobacco abuse Smoking cessation instruction/counseling given:  counseled patient on the dangers of tobacco use, advised patient to stop smoking, and reviewed strategies to maximize success   Return in about 1 month (around 03/22/2019) for f/up on med changes/MDD.   Delsa Grana, PA-C 02/19/19 11:58 AM

## 2019-02-20 LAB — CBC WITH DIFFERENTIAL/PLATELET
Absolute Monocytes: 502 cells/uL (ref 200–950)
Basophils Absolute: 38 cells/uL (ref 0–200)
Basophils Relative: 0.5 %
Eosinophils Absolute: 213 cells/uL (ref 15–500)
Eosinophils Relative: 2.8 %
HCT: 36.2 % (ref 35.0–45.0)
Hemoglobin: 12.1 g/dL (ref 11.7–15.5)
Lymphs Abs: 1611 cells/uL (ref 850–3900)
MCH: 31.7 pg (ref 27.0–33.0)
MCHC: 33.4 g/dL (ref 32.0–36.0)
MCV: 94.8 fL (ref 80.0–100.0)
MPV: 9.4 fL (ref 7.5–12.5)
Monocytes Relative: 6.6 %
Neutro Abs: 5236 cells/uL (ref 1500–7800)
Neutrophils Relative %: 68.9 %
Platelets: 256 10*3/uL (ref 140–400)
RBC: 3.82 10*6/uL (ref 3.80–5.10)
RDW: 12.7 % (ref 11.0–15.0)
Total Lymphocyte: 21.2 %
WBC: 7.6 10*3/uL (ref 3.8–10.8)

## 2019-02-20 LAB — COMPLETE METABOLIC PANEL WITH GFR
AG Ratio: 2.1 (calc) (ref 1.0–2.5)
ALT: 11 U/L (ref 6–29)
AST: 13 U/L (ref 10–35)
Albumin: 3.8 g/dL (ref 3.6–5.1)
Alkaline phosphatase (APISO): 58 U/L (ref 37–153)
BUN: 13 mg/dL (ref 7–25)
CO2: 31 mmol/L (ref 20–32)
Calcium: 9.2 mg/dL (ref 8.6–10.4)
Chloride: 108 mmol/L (ref 98–110)
Creat: 0.87 mg/dL (ref 0.50–1.05)
GFR, Est African American: 89 mL/min/{1.73_m2} (ref >=60)
GFR, Est Non African American: 77 mL/min/{1.73_m2} (ref >=60)
Globulin: 1.8 g/dL (calc) — ABNORMAL LOW (ref 1.9–3.7)
Glucose, Bld: 101 mg/dL — ABNORMAL HIGH (ref 65–99)
Potassium: 4 mmol/L (ref 3.5–5.3)
Sodium: 143 mmol/L (ref 135–146)
Total Bilirubin: 0.3 mg/dL (ref 0.2–1.2)
Total Protein: 5.6 g/dL — ABNORMAL LOW (ref 6.1–8.1)

## 2019-02-20 LAB — LIPID PANEL
Cholesterol: 178 mg/dL (ref <200)
HDL: 47 mg/dL — ABNORMAL LOW (ref >=50)
LDL Cholesterol (Calc): 106 mg/dL (calc) — ABNORMAL HIGH
Non-HDL Cholesterol (Calc): 131 mg/dL (calc) — ABNORMAL HIGH (ref <130)
Total CHOL/HDL Ratio: 3.8 (calc) (ref <5.0)
Triglycerides: 142 mg/dL (ref <150)

## 2019-02-20 LAB — HEMOGLOBIN A1C
Hgb A1c MFr Bld: 5.6 % of total Hgb (ref <5.7)
Mean Plasma Glucose: 114 (calc)
eAG (mmol/L): 6.3 (calc)

## 2019-02-20 LAB — TSH: TSH: 3.56 mIU/L

## 2019-02-26 ENCOUNTER — Encounter: Payer: Self-pay | Admitting: Family Medicine

## 2019-03-14 ENCOUNTER — Other Ambulatory Visit: Payer: Self-pay | Admitting: Family Medicine

## 2019-03-14 DIAGNOSIS — F605 Obsessive-compulsive personality disorder: Secondary | ICD-10-CM

## 2019-03-14 DIAGNOSIS — F331 Major depressive disorder, recurrent, moderate: Secondary | ICD-10-CM

## 2019-03-14 DIAGNOSIS — F419 Anxiety disorder, unspecified: Secondary | ICD-10-CM

## 2019-03-18 ENCOUNTER — Other Ambulatory Visit: Payer: Self-pay | Admitting: Family Medicine

## 2019-03-18 DIAGNOSIS — F605 Obsessive-compulsive personality disorder: Secondary | ICD-10-CM

## 2019-03-18 DIAGNOSIS — F331 Major depressive disorder, recurrent, moderate: Secondary | ICD-10-CM

## 2019-03-18 DIAGNOSIS — F419 Anxiety disorder, unspecified: Secondary | ICD-10-CM

## 2019-03-22 ENCOUNTER — Encounter: Payer: Self-pay | Admitting: Family Medicine

## 2019-03-23 ENCOUNTER — Ambulatory Visit (INDEPENDENT_AMBULATORY_CARE_PROVIDER_SITE_OTHER): Payer: 59 | Admitting: Family Medicine

## 2019-03-23 ENCOUNTER — Encounter: Payer: Self-pay | Admitting: Family Medicine

## 2019-03-23 ENCOUNTER — Other Ambulatory Visit: Payer: Self-pay

## 2019-03-23 VITALS — Ht 65.5 in | Wt 143.0 lb

## 2019-03-23 DIAGNOSIS — F331 Major depressive disorder, recurrent, moderate: Secondary | ICD-10-CM | POA: Diagnosis not present

## 2019-03-23 DIAGNOSIS — F419 Anxiety disorder, unspecified: Secondary | ICD-10-CM

## 2019-03-23 DIAGNOSIS — E7800 Pure hypercholesterolemia, unspecified: Secondary | ICD-10-CM

## 2019-03-23 DIAGNOSIS — F605 Obsessive-compulsive personality disorder: Secondary | ICD-10-CM

## 2019-03-23 MED ORDER — ROSUVASTATIN CALCIUM 20 MG PO TABS: 20.0000 mg | ORAL_TABLET | Freq: Every day | ORAL | 3 refills | Status: DC

## 2019-03-23 MED ORDER — QUETIAPINE FUMARATE 50 MG PO TABS: 50.0000 mg | ORAL_TABLET | Freq: Every day | ORAL | 1 refills | Status: DC

## 2019-03-23 NOTE — Progress Notes (Signed)
Name: Charlotte Thompson   MRN: [id number]    DOB: 08-Sep-1965   Date:03/23/2019       Progress Note  Subjective:    Chief Complaint  Chief Complaint  Patient presents with  . Medication Management    f/u on Seroquel    I connected with  Dara Hoyer on 03/23/19 at 10:00 AM EST by telephone and verified that I am speaking with the correct person using two identifiers.   I discussed the limitations, risks, security and privacy concerns of performing an evaluation and management service by telephone and the availability of in person appointments. Staff also discussed with the patient that there may be a patient responsible charge related to this service. Patient Location: home Provider Location: [facility] Additional Individuals present:  none  HPI  Cholesterol - labs recently checked she is concerned that her numbers have not improved I did review her trends in her lipid profile with her over the past several labs LDL was previously elevated 1 40-1 50 and with her most recent labs is 106, total cholesterol improved to 178 and HDL still slightly low at 47.  She is concerned about not having "good enough numbers" because she is taking a very high dose of pravastatin and she is very compliant with it.  She would like to change to a different medication to see if she can have further improvement in her LDL.    We also reviewed the few other abnormal labs -very mild and unremarkable that all patient's questions were answered   Follow-up on med changes for MDD, OCD, anxiety disorder She has continued to take venlafaxine 225 mg and restarted Seroquel titrating dose up from 25 mg up to 50 mg, she has been taking both medications at bedtime.   Moods are getting better, she is still not getting good sleep, falls asleep fine but always wakes up and has a hard time getting back to sleep she denies any agitation, anger, SI, HI, AVH Depression screen [facility] 2/9 03/23/2019 02/19/2019 07/21/2018  Decreased  Interest 2 3 0  Down, Depressed, Hopeless 1 3 0  PHQ - 2 Score 3 6 0  Altered sleeping 3 3 0  Tired, decreased energy 3 2 0  Change in appetite 0 2 0  Feeling bad or failure about yourself  2 3 0  Trouble concentrating 0 1 0  Moving slowly or fidgety/restless 0 0 0  Suicidal thoughts 0 1 0  PHQ-9 Score 11 18 0  Difficult doing work/chores Not difficult at all Somewhat difficult Not difficult at all  Some recent data might be hidden   PHQ positive but improving GAD 7 : Generalized Anxiety Score 02/19/2019  Nervous, Anxious, on Edge 2  Control/stop worrying 3  Worry too much - different things 2  Trouble relaxing 3  Restless 0  Easily annoyed or irritable 3  Afraid - awful might happen 2  Total GAD 7 Score 15  Anxiety Difficulty Somewhat difficult     Patient Active Problem List   Diagnosis Date Noted  . Major depressive disorder, recurrent ([place]) 12/06/2017  . 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[facility]) 10/07/2016  . Prolonged grief reaction 09/23/2016  . Aggressive behavior 06/29/2016  . Fatigue 06/29/2016  . Vitamin D deficiency 06/29/2016  . Vitamin B12 deficiency 06/29/2016  . Microalbuminuria 12/27/2015  . Insomnia 12/27/2015  . Anxiety and depression 12/05/2015  . Irritability 06/21/2015  . Hypothyroidism 06/16/2015  . Tobacco abuse 06/16/2015  . Hypercholesterolemia 06/16/2015  .  Medication monitoring encounter 06/16/2015  . Family history of chronic renal disease 06/16/2015  . Hx of iron deficiency anemia 06/16/2015  . Heart murmur 06/16/2015  . Benign cyst of skin 10/18/2014  . Hot flashes, menopausal 09/02/2014    Social History   Tobacco Use  . Smoking status: Current Every Day Smoker    Years: 16.00    Types: Cigarettes    Start date: 03/24/2014  . Smokeless tobacco: Never Used  Substance Use Topics  . Alcohol use: No    Alcohol/week: 0.0 standard drinks     Current Outpatient Medications:  .  albuterol (PROVENTIL HFA;VENTOLIN HFA)  108 (90 Base) MCG/ACT inhaler, Inhale 2 puffs into the lungs every 6 (six) hours as needed for wheezing or shortness of breath., Disp: 1 Inhaler, Rfl: 2 .  albuterol (VENTOLIN HFA) 108 (90 Base) MCG/ACT inhaler, Inhale 2 puffs into the lungs every 6 (six) hours as needed for wheezing or shortness of breath., Disp: 1 Inhaler, Rfl: 2 .  docusate sodium (COLACE) 50 MG capsule, Take 50 mg by mouth daily as needed for mild constipation., Disp: , Rfl:  .  fluticasone (FLONASE) 50 MCG/ACT nasal spray, Place 2 sprays into both nostrils daily., Disp: 16 g, Rfl: 6 .  levothyroxine (SYNTHROID) 112 MCG tablet, Take 1 tablet (112 mcg total) by mouth daily., Disp: 90 tablet, Rfl: 3 .  pravastatin (PRAVACHOL) 80 MG tablet, Take 1 tablet (80 mg total) by mouth at bedtime., Disp: 90 tablet, Rfl: 3 .  QUEtiapine (SEROQUEL) 50 MG tablet, Take 1/2 tab po at bedtime x 2 week, then take 1 tab 50 mg po at bedtime daily, Disp: 45 tablet, Rfl: 0 .  venlafaxine XR (EFFEXOR-XR) 75 MG 24 hr capsule, Take 3 capsules (225 mg total) by mouth daily with breakfast., Disp: 270 capsule, Rfl: 1 .  HYDROcodone-acetaminophen (NORCO) 5-325 MG tablet, Take 1 tablet by mouth every 4 (four) hours as needed for moderate pain. (Patient not taking: Reported on 02/19/2019), Disp: 6 tablet, Rfl: 0 .  ondansetron (ZOFRAN) 4 MG tablet, Take 1 tablet (4 mg total) by mouth every 8 (eight) hours as needed for nausea or vomiting. (Patient not taking: Reported on 02/19/2019), Disp: 20 tablet, Rfl: 0  Allergies  Allergen Reactions  . Aspirin Nausea And Vomiting  . Codeine Hives    Chart Review: I personally reviewed active problem list, medication list, allergies, family history, social history, health maintenance, notes from last encounter, lab results, imaging with the patient/caregiver today.   Review of Systems  Constitutional: Negative.   HENT: Negative.   Eyes: Negative.   Respiratory: Negative.   Cardiovascular: Negative.     Gastrointestinal: Negative.   Endocrine: Negative.   Genitourinary: Negative.   Musculoskeletal: Negative.   Skin: Negative.   Allergic/Immunologic: Negative.   Neurological: Negative.   Hematological: Negative.   Psychiatric/Behavioral: Negative.   All other systems reviewed and are negative.    Objective:    Virtual encounter, vitals limited, only able to obtain the following Today's Vitals   03/23/19 0927  Weight: 143 lb (64.9 kg)  Height: 5' 5.5" (1.664 m)   Body mass index is 23.43 kg/m. Nursing Note and Vital Signs reviewed.  Physical Exam Vitals and nursing note reviewed.  Neurological:     Mental Status: She is alert.  Psychiatric:        Mood and Affect: Mood normal.        Behavior: Behavior normal.     PE limited by telephone encounter  No results found for this or any previous visit (from the past 72 hour(s)).  Assessment and Plan:     ICD-10-CM   1. Moderate episode of recurrent major depressive disorder (HCC)  F33.1 QUEtiapine (SEROQUEL) 50 MG tablet   improving with med changes, con't meds, f/up in 3-4 months  2. 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(HCC)  F60.5 QUEtiapine (SEROQUEL) 50 MG tablet  3. Anxiety disorder, unspecified type  F41.9 QUEtiapine (SEROQUEL) 50 MG tablet   Improving  4. Hypercholesterolemia  E78.00    Patient concerned with elevated LDL wants to switch medication D/C pravastatin and start Crestor 20 mg follow-up in 4 months    -Red flags and when to present for emergency care or RTC including but not limited to new/worsening/un-resolving symptoms,  reviewed with patient at time of visit. Follow up and care instructions discussed and provided in AVS. - I discussed the assessment and treatment plan with the patient. The patient was provided an opportunity to ask questions and all were answered. The patient agreed with the plan and demonstrated an understanding of the instructions.  - The patient was advised to call  back or seek an in-person evaluation if the symptoms worsen or if the condition fails to improve as anticipated.  I provided 12 minutes of non-face-to-face time during this encounter.  Patient will follow up for her chronic conditions, hyperlipidemia treatment management changes and lab work, and continue to monitor psychiatric medications effectiveness etc. in about 4 months  Danelle Berry, PA-C 03/23/19 11:00 AM

## 2019-03-27 ENCOUNTER — Ambulatory Visit: Payer: 59 | Admitting: Family Medicine

## 2019-04-11 ENCOUNTER — Ambulatory Visit: Payer: 59 | Attending: Internal Medicine

## 2019-04-11 DIAGNOSIS — Z20822 Contact with and (suspected) exposure to covid-19: Secondary | ICD-10-CM

## 2019-04-13 LAB — NOVEL CORONAVIRUS, NAA: SARS-CoV-2, NAA: NOT DETECTED

## 2019-05-19 ENCOUNTER — Other Ambulatory Visit: Payer: Self-pay | Admitting: Family Medicine

## 2019-05-19 DIAGNOSIS — F605 Obsessive-compulsive personality disorder: Secondary | ICD-10-CM

## 2019-05-19 DIAGNOSIS — F419 Anxiety disorder, unspecified: Secondary | ICD-10-CM

## 2019-05-19 DIAGNOSIS — F331 Major depressive disorder, recurrent, moderate: Secondary | ICD-10-CM

## 2019-08-17 ENCOUNTER — Encounter: Payer: Self-pay | Admitting: Family Medicine

## 2019-08-19 ENCOUNTER — Other Ambulatory Visit: Payer: Self-pay | Admitting: Family Medicine

## 2019-08-19 DIAGNOSIS — F605 Obsessive-compulsive personality disorder: Secondary | ICD-10-CM

## 2019-08-19 DIAGNOSIS — F331 Major depressive disorder, recurrent, moderate: Secondary | ICD-10-CM

## 2019-08-19 DIAGNOSIS — F419 Anxiety disorder, unspecified: Secondary | ICD-10-CM

## 2019-09-13 ENCOUNTER — Other Ambulatory Visit: Payer: Self-pay | Admitting: Family Medicine

## 2019-09-13 DIAGNOSIS — F605 Obsessive-compulsive personality disorder: Secondary | ICD-10-CM

## 2019-09-13 DIAGNOSIS — F331 Major depressive disorder, recurrent, moderate: Secondary | ICD-10-CM

## 2019-09-13 DIAGNOSIS — F419 Anxiety disorder, unspecified: Secondary | ICD-10-CM

## 2019-09-14 ENCOUNTER — Other Ambulatory Visit: Payer: Self-pay | Admitting: Family Medicine

## 2019-09-14 DIAGNOSIS — F331 Major depressive disorder, recurrent, moderate: Secondary | ICD-10-CM

## 2019-09-14 DIAGNOSIS — F419 Anxiety disorder, unspecified: Secondary | ICD-10-CM

## 2019-09-14 DIAGNOSIS — F605 Obsessive-compulsive personality disorder: Secondary | ICD-10-CM

## 2019-09-14 NOTE — Telephone Encounter (Signed)
Pt is overdue for f/up appt- as of Jan was supposed to f/up in 4 months, no other refills w/o f/up OV  Currently no appts scheduled Will need to have OV at minimal 2x a year due to current meds and dx.

## 2019-09-18 NOTE — Telephone Encounter (Signed)
lvm informing pt that prescription has been sent to pharmacy and asked her to schedule an appt for additional refills

## 2019-10-14 ENCOUNTER — Other Ambulatory Visit: Payer: Self-pay | Admitting: Family Medicine

## 2019-10-14 DIAGNOSIS — F419 Anxiety disorder, unspecified: Secondary | ICD-10-CM

## 2019-10-14 DIAGNOSIS — F605 Obsessive-compulsive personality disorder: Secondary | ICD-10-CM

## 2019-10-14 DIAGNOSIS — F331 Major depressive disorder, recurrent, moderate: Secondary | ICD-10-CM

## 2019-10-15 NOTE — Telephone Encounter (Signed)
Pt needs appt

## 2019-10-15 NOTE — Telephone Encounter (Signed)
appt made [facility]. [facility] not avail

## 2019-10-17 NOTE — Progress Notes (Signed)
Patient ID: Charlotte Thompson, female    DOB: 1965-11-13, 54 y.o.   MRN: [id number]  PCP: Danelle Berry, PA-C  Chief Complaint  Patient presents with  . Follow-up    medication refill    Subjective:   Charlotte Thompson is a 54 y.o. female, presents to clinic with CC of the following:  Chief Complaint  Patient presents with  . Follow-up    medication refill    HPI:  Patient is a 54 year old female patient of Danelle Berry Last visit with her was in January 2021, a telephone visit Follows up today with a med refill request  Hyperlipidemia- Medication regimen-crestor 20 mg started last visit, and the pravastatin she was taking previously was stopped (patient wanted to change medicines) Recent Labs       Lab Results  Component Value Date   CHOL 178 02/19/2019   HDL 47 (L) 02/19/2019   LDLCALC 106 (H) 02/19/2019   TRIG 142 02/19/2019   CHOLHDL 3.8 02/19/2019     Last visit, it was noted she was concerned that her numbers have not improved, yet her LDL was previously elevated 140-150 and with her most recent labs is 106, total cholesterol improved to 178 and HDL still slightly low at 47. She is concerned about not having "good enough numbers" because she is taking a very high dose of pravastatin and she is very compliant with it.  She would like to change to a different medication to see if she can have further improvement in her LDL.   No myalgias Denies any chest pains, palpitations, shortness of breath, no increased lower extremity swelling.  No vision changes of concern.  MDD, OCD, anxiety disorder Medication regimen-venlafaxine XR 225 mg daily and Seroquel 50 mg, taking both medications at bedtime.  Improvement was noted last visit , moods getting better, and that has continued. She notes with these 2 medications, she is doing really well.  Hypothyroid On levothyroxine for 20 years, no change in dosage in the last years. Energy levels have been  good. Lab Results  Component Value Date   TSH 3.56 02/19/2019   Tob - current everyday smoker Had Covid vaccine  Patient Active Problem List   Diagnosis Date Noted  . Major depressive disorder, recurrent (HCC) 12/06/2017  . 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 Prolonged grief reaction 09/23/2016  . Aggressive behavior 06/29/2016  . Fatigue 06/29/2016  . Vitamin D deficiency 06/29/2016  . Vitamin B12 deficiency 06/29/2016  . Microalbuminuria 12/27/2015  . Insomnia 12/27/2015  . Anxiety and depression 12/05/2015  . Irritability 06/21/2015  . Hypothyroidism 06/16/2015  . Tobacco abuse 06/16/2015  . Hypercholesterolemia 06/16/2015  . Medication monitoring encounter 06/16/2015  . Family history of chronic renal disease 06/16/2015  . Hx of iron deficiency anemia 06/16/2015  . Heart murmur 06/16/2015  . Benign cyst of skin 10/18/2014  . Hot flashes, menopausal 09/02/2014      Current Outpatient Medications:  .  docusate sodium (COLACE) 50 MG capsule, Take 50 mg by mouth daily as needed for mild constipation., Disp: , Rfl:  .  levothyroxine (SYNTHROID) 112 MCG tablet, Take 1 tablet (112 mcg total) by mouth daily., Disp: 90 tablet, Rfl: 3 .  QUEtiapine (SEROQUEL) 50 MG tablet, Take 1 tablet (50 mg total) by mouth at bedtime., Disp: 90 tablet, Rfl: 3 .  rosuvastatin (CRESTOR) 20 MG tablet, Take 1 tablet (20 mg total) by mouth daily., Disp: 90 tablet, Rfl: 3 .  venlafaxine  XR (EFFEXOR-XR) 75 MG 24 hr capsule, TAKE 3 CAPSULES (225 MG TOTAL) BY MOUTH DAILY WITH BREAKFAST., Disp: 270 capsule, Rfl: 3 .  albuterol (PROVENTIL HFA;VENTOLIN HFA) 108 (90 Base) MCG/ACT inhaler, Inhale 2 puffs into the lungs every 6 (six) hours as needed for wheezing or shortness of breath., Disp: 1 Inhaler, Rfl: 2 .  albuterol (VENTOLIN HFA) 108 (90 Base) MCG/ACT inhaler, Inhale 2 puffs into the lungs every 6 (six) hours as needed for wheezing or shortness of breath., Disp: 1  Inhaler, Rfl: 2 .  fluticasone (FLONASE) 50 MCG/ACT nasal spray, Place 2 sprays into both nostrils daily., Disp: 16 g, Rfl: 6 .  HYDROcodone-acetaminophen (NORCO) 5-325 MG tablet, Take 1 tablet by mouth every 4 (four) hours as needed for moderate pain. (Patient not taking: Reported on 02/19/2019), Disp: 6 tablet, Rfl: 0   Allergies  Allergen Reactions  . Aspirin Nausea And Vomiting  . Codeine Hives     Past Surgical History:  Procedure Laterality Date  . COLONOSCOPY    . PARTIAL HYSTERECTOMY  2010     Family History  Problem Relation Age of Onset  . Autoimmune disease Father   . Kidney failure Father   . Heart disease Father   . Heart attack Father   . Hypertension Father   . Hyperlipidemia Father   . Clotting disorder Mother   . Kidney disease Mother   . Heart attack Mother   . Thyroid disease Sister   . Cancer Sister        breast  . Breast cancer Sister 51  . Thyroid disease Daughter   . Diabetes Paternal Grandmother   . Diabetes type II Paternal Grandmother   . Hypertension Daughter   . Hearing loss Maternal Grandmother   . Cancer Maternal Grandmother        colon  . Cancer Maternal Grandfather        prostate  . Pneumonia Paternal Grandfather   . Cancer Paternal Grandfather        bone     Social History   Tobacco Use  . Smoking status: Current Every Day Smoker    Years: 16.00    Types: Cigarettes    Start date: 03/24/2014  . Smokeless tobacco: Never Used  Substance Use Topics  . Alcohol use: No    Alcohol/week: 0.0 standard drinks    With staff assistance, above reviewed with the patient today.  ROS: As per HPI, otherwise no specific complaints on a limited and focused system review   No results found for this or any previous visit (from the past 72 hour(s)).   PHQ2/9: Depression screen [facility] 2/9 10/18/2019 03/23/2019 02/19/2019 07/21/2018 06/12/2018  Decreased Interest 0 2 3 0 0  Down, Depressed, Hopeless 0 1 3 0 0  PHQ - 2 Score 0 3 6 0 0  Altered  sleeping 1 3 3  0 0  Tired, decreased energy 1 3 2  0 3  Change in appetite 0 0 2 0 0  Feeling bad or failure about yourself  0 2 3 0 0  Trouble concentrating 0 0 1 0 0  Moving slowly or fidgety/restless 0 0 0 0 0  Suicidal thoughts 0 0 1 0 0  PHQ-9 Score 2 11 18  0 3  Difficult doing work/chores Not difficult at all Not difficult at all Somewhat difficult Not difficult at all Not difficult at all  Some recent data might be hidden   PHQ-2/9 Result reviewed  Fall Risk: Fall Risk  10/18/2019  03/23/2019 02/19/2019 07/21/2018 06/12/2018  Falls in the past year? 0 0 0 0 0  Number falls in past yr: 0 0 0 0 0  Injury with Fall? 0 0 0 0 0  Follow up - - - - Falls evaluation completed      Objective:   Vitals:   10/18/19 0734  BP: 118/84  Pulse: 99  Resp: 16  Temp: 98.7 F (37.1 C)  TempSrc: Temporal  Weight: 153 lb 11.2 oz (69.7 kg)  Height: 5\' 5"  (1.651 m)    Body mass index is 25.58 kg/m.  Physical Exam   NAD, masked, very pleasant HEENT - [place]/AT, sclera anicteric, PERRL, positive glasses, EOMI, conj - non-inj'ed, pharynx clear Neck - supple, no adenopathy, no TM, carotids 2+ and = without bruits bilat Car - RRR without m/g/r Pulm- RR and effort normal at rest, CTA without wheeze or rales Abd - soft, NT diffusely, ND,  Back - no CVA tenderness Ext - no LE edema,  Neuro/psychiatric - affect was not flat, appropriate with conversation  Alert and oriented  Grossly non-focal   Speech  normal   Results for orders placed or performed in visit on 04/11/19  Novel Coronavirus, NAA ([facility])   Specimen: Nasopharyngeal(NP) swabs in vial transport medium   NASOPHARYNGE  TESTING  Result Value Ref Range   SARS-CoV-2, NAA Not Detected Not Detected   Last labs reviewed.     Assessment & Plan:   1. Other specified hypothyroidism Doing well on her current Synthroid dose, and continue presently Last TSH check good in December 2020, will recheck again on follow-up visit. - levothyroxine  (SYNTHROID) 112 MCG tablet; Take 1 tablet (112 mcg total) by mouth daily.  Dispense: 90 tablet; Refill: 3  2. Major depressive disorder in remission, unspecified whether recurrent (HCC) 3. 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 Anxiety disorder, unspecified type Doing very well on the medication regimen of Effexor and Seroquel. PHQ-9 reviewed today. We will continue the current medication regimen.  5. Hypercholesterolemia Has done well on the Crestor. She desires to check labs again on her follow-up visit, and not today, and feel that is very reasonable. Her last lipid panel was reviewed, not markedly abnormal. - rosuvastatin (CRESTOR) 20 MG tablet; Take 1 tablet (20 mg total) by mouth daily.  Dispense: 90 tablet; Refill: 3   Tentatively, scheduled to follow-up in 6 months time, sooner as needed and will recheck labs on that follow-up visit.  Recommended she be fasting when she presents to have them done.        03-20-2004, MD 10/18/19 7:44 AM

## 2019-10-18 ENCOUNTER — Other Ambulatory Visit: Payer: Self-pay

## 2019-10-18 ENCOUNTER — Encounter: Payer: Self-pay | Admitting: Internal Medicine

## 2019-10-18 ENCOUNTER — Ambulatory Visit (INDEPENDENT_AMBULATORY_CARE_PROVIDER_SITE_OTHER): Payer: Self-pay | Admitting: Internal Medicine

## 2019-10-18 VITALS — BP 118/84 | HR 99 | Temp 98.7°F | Resp 16 | Ht 65.0 in | Wt 153.7 lb

## 2019-10-18 DIAGNOSIS — E038 Other specified hypothyroidism: Secondary | ICD-10-CM

## 2019-10-18 DIAGNOSIS — E7800 Pure hypercholesterolemia, unspecified: Secondary | ICD-10-CM

## 2019-10-18 DIAGNOSIS — F605 Obsessive-compulsive personality disorder: Secondary | ICD-10-CM

## 2019-10-18 DIAGNOSIS — F325 Major depressive disorder, single episode, in full remission: Secondary | ICD-10-CM

## 2019-10-18 DIAGNOSIS — F419 Anxiety disorder, unspecified: Secondary | ICD-10-CM

## 2019-10-18 MED ORDER — ROSUVASTATIN CALCIUM 20 MG PO TABS: 20.0000 mg | ORAL_TABLET | Freq: Every day | ORAL | 3 refills | Status: DC

## 2019-10-18 MED ORDER — LEVOTHYROXINE SODIUM 112 MCG PO TABS: 112.0000 ug | ORAL_TABLET | Freq: Every day | ORAL | 3 refills | Status: DC

## 2019-10-18 NOTE — Patient Instructions (Signed)

## 2019-12-16 ENCOUNTER — Other Ambulatory Visit: Payer: Self-pay | Admitting: Family Medicine

## 2019-12-16 DIAGNOSIS — F419 Anxiety disorder, unspecified: Secondary | ICD-10-CM

## 2019-12-16 DIAGNOSIS — R4689 Other symptoms and signs involving appearance and behavior: Secondary | ICD-10-CM

## 2019-12-16 DIAGNOSIS — F605 Obsessive-compulsive personality disorder: Secondary | ICD-10-CM

## 2019-12-16 DIAGNOSIS — F331 Major depressive disorder, recurrent, moderate: Secondary | ICD-10-CM

## 2020-01-14 ENCOUNTER — Other Ambulatory Visit: Payer: Self-pay | Admitting: Family Medicine

## 2020-01-14 DIAGNOSIS — F331 Major depressive disorder, recurrent, moderate: Secondary | ICD-10-CM

## 2020-01-14 DIAGNOSIS — F419 Anxiety disorder, unspecified: Secondary | ICD-10-CM

## 2020-01-14 DIAGNOSIS — F605 Obsessive-compulsive personality disorder: Secondary | ICD-10-CM

## 2020-03-15 ENCOUNTER — Other Ambulatory Visit: Payer: Self-pay | Admitting: Family Medicine

## 2020-03-15 DIAGNOSIS — F331 Major depressive disorder, recurrent, moderate: Secondary | ICD-10-CM

## 2020-03-15 DIAGNOSIS — F605 Obsessive-compulsive personality disorder: Secondary | ICD-10-CM

## 2020-03-15 DIAGNOSIS — F419 Anxiety disorder, unspecified: Secondary | ICD-10-CM

## 2020-04-22 ENCOUNTER — Ambulatory Visit: Payer: Self-pay | Admitting: Family Medicine

## 2020-04-28 ENCOUNTER — Ambulatory Visit (INDEPENDENT_AMBULATORY_CARE_PROVIDER_SITE_OTHER): Payer: No Typology Code available for payment source | Admitting: Family Medicine

## 2020-04-28 ENCOUNTER — Encounter: Payer: Self-pay | Admitting: Family Medicine

## 2020-04-28 ENCOUNTER — Other Ambulatory Visit: Payer: Self-pay

## 2020-04-28 VITALS — BP 132/80 | HR 98 | Temp 98.2°F | Resp 16 | Ht 65.0 in | Wt 165.8 lb

## 2020-04-28 DIAGNOSIS — F419 Anxiety disorder, unspecified: Secondary | ICD-10-CM

## 2020-04-28 DIAGNOSIS — Z23 Encounter for immunization: Secondary | ICD-10-CM | POA: Diagnosis not present

## 2020-04-28 DIAGNOSIS — Z1159 Encounter for screening for other viral diseases: Secondary | ICD-10-CM

## 2020-04-28 DIAGNOSIS — F325 Major depressive disorder, single episode, in full remission: Secondary | ICD-10-CM | POA: Diagnosis not present

## 2020-04-28 DIAGNOSIS — R739 Hyperglycemia, unspecified: Secondary | ICD-10-CM

## 2020-04-28 DIAGNOSIS — E7800 Pure hypercholesterolemia, unspecified: Secondary | ICD-10-CM | POA: Diagnosis not present

## 2020-04-28 DIAGNOSIS — E038 Other specified hypothyroidism: Secondary | ICD-10-CM

## 2020-04-28 DIAGNOSIS — Z5181 Encounter for therapeutic drug level monitoring: Secondary | ICD-10-CM

## 2020-04-28 DIAGNOSIS — Z114 Encounter for screening for human immunodeficiency virus [HIV]: Secondary | ICD-10-CM

## 2020-04-28 DIAGNOSIS — Z1231 Encounter for screening mammogram for malignant neoplasm of breast: Secondary | ICD-10-CM

## 2020-04-28 NOTE — Progress Notes (Signed)
Name: Charlotte Thompson   MRN: [id number]    DOB: 05-14-1965   Date:04/28/2020       Progress Note  Chief Complaint  Patient presents with  . Follow-up    6 months  . Hyperlipidemia  . Hypothyroidism  . Anxiety  . Depression     Subjective:   Charlotte Thompson is a 55 y.o. female, presents to clinic for routine f/up  HLD- about a year ago she wanted to change pravastatin to different med, has been on crestor 20 mg daily, good compliance and tolerating - overdue for the med f/up and labs  Anxiety/depression/insomnia/OCD On venlafaxine 225 daily and we last year restarted seroquel at bedtime to help with insomnia - she was on in the past and wanted to restart it It is working well, moods are good, but she feels tired Depression screen [facility] 2/9 04/28/2020 10/18/2019 03/23/2019  Decreased Interest 3 0 2  Down, Depressed, Hopeless 1 0 1  PHQ - 2 Score 4 0 3  Altered sleeping 3 1 3   Tired, decreased energy 3 1 3   Change in appetite 3 0 0  Feeling bad or failure about yourself  1 0 2  Trouble concentrating 0 0 0  Moving slowly or fidgety/restless 1 0 0  Suicidal thoughts 0 0 0  PHQ-9 Score 15 2 11   Difficult doing work/chores - Not difficult at all Not difficult at all  Some recent data might be hidden   GAD 7 : Generalized Anxiety Score 04/28/2020 02/19/2019  Nervous, Anxious, on Edge 3 2  Control/stop worrying 3 3  Worry too much - different things 3 2  Trouble relaxing 3 3  Restless 3 0  Easily annoyed or irritable 1 3  Afraid - awful might happen 0 2  Total GAD 7 Score 16 15  Anxiety Difficulty Very difficult Somewhat difficult    Hypothyroid -on levothyroxine 112 mcg dose daily, takes in the morning on empty stomach,  Tired, napping, mood a little worse, lost interest, gained about 11 lbs over the past 6 months w/o change to diet/lifestyle She was on higher dose in the past    Current Outpatient Medications:  .  docusate sodium (COLACE) 50 MG capsule, Take 50 mg by  mouth daily as needed for mild constipation., Disp: , Rfl:  .  levothyroxine (SYNTHROID) 112 MCG tablet, Take 1 tablet (112 mcg total) by mouth daily., Disp: 90 tablet, Rfl: 3 .  QUEtiapine (SEROQUEL) 50 MG tablet, TAKE 1 TABLET BY MOUTH EVERYDAY AT BEDTIME, Disp: 90 tablet, Rfl: 0 .  rosuvastatin (CRESTOR) 20 MG tablet, Take 1 tablet (20 mg total) by mouth daily., Disp: 90 tablet, Rfl: 3 .  venlafaxine XR (EFFEXOR-XR) 75 MG 24 hr capsule, TAKE 3 CAPSULES (225 MG TOTAL) BY MOUTH DAILY WITH BREAKFAST., Disp: 270 capsule, Rfl: 3  Patient Active Problem List   Diagnosis Date Noted  . Major depressive disorder, recurrent (HCC) 12/06/2017  . 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 Prolonged grief reaction 09/23/2016  . Aggressive behavior 06/29/2016  . Fatigue 06/29/2016  . Vitamin D deficiency 06/29/2016  . Vitamin B12 deficiency 06/29/2016  . Microalbuminuria 12/27/2015  . Insomnia 12/27/2015  . Anxiety and depression 12/05/2015  . Irritability 06/21/2015  . Hypothyroidism 06/16/2015  . Tobacco abuse 06/16/2015  . Hypercholesterolemia 06/16/2015  . Medication monitoring encounter 06/16/2015  . Family history of chronic renal disease 06/16/2015  . Hx of iron deficiency anemia 06/16/2015  . Heart murmur 06/16/2015  .  Benign cyst of skin 10/18/2014  . Hot flashes, menopausal 09/02/2014    Past Surgical History:  Procedure Laterality Date  . COLONOSCOPY    . PARTIAL HYSTERECTOMY  2010    Family History  Problem Relation Age of Onset  . Autoimmune disease Father   . Kidney failure Father   . Heart disease Father   . Heart attack Father   . Hypertension Father   . Hyperlipidemia Father   . Clotting disorder Mother   . Kidney disease Mother   . Heart attack Mother   . Thyroid disease Sister   . Cancer Sister        breast  . Breast cancer Sister 39  . Thyroid disease Daughter   . Diabetes Paternal Grandmother   . Diabetes type II Paternal  Grandmother   . Hypertension Daughter   . Hearing loss Maternal Grandmother   . Cancer Maternal Grandmother        colon  . Cancer Maternal Grandfather        prostate  . Pneumonia Paternal Grandfather   . Cancer Paternal Grandfather        bone    Social History   Tobacco Use  . Smoking status: Current Every Day Smoker    Years: 16.00    Types: Cigarettes    Start date: 03/24/2014  . Smokeless tobacco: Never Used  Vaping Use  . Vaping Use: Never used  Substance Use Topics  . Alcohol use: No    Alcohol/week: 0.0 standard drinks  . Drug use: No     Allergies  Allergen Reactions  . Aspirin Nausea And Vomiting  . Codeine Hives    Health Maintenance  Topic Date Due  . MAMMOGRAM  09/28/2017  . PAP SMEAR-Modifier  07/14/2018  . COLONOSCOPY (Pts 45-20yrs Insurance coverage will need to be confirmed)  10/17/2020 (Originally 12/14/2011)  . Hepatitis C Screening  10/17/2020 (Originally [birth year])  . TETANUS/TDAP  06/30/2026  . INFLUENZA VACCINE  Completed  . COVID-19 Vaccine  Completed  . HIV Screening  Completed    Chart Review Today: I personally reviewed active problem list, medication list, allergies, family history, social history, health maintenance, notes from last encounter, lab results, imaging with the patient/caregiver today.   Review of Systems  Constitutional: Negative.   HENT: Negative.   Eyes: Negative.   Respiratory: Negative.   Cardiovascular: Negative.   Gastrointestinal: Negative.   Endocrine: Negative.   Genitourinary: Negative.   Musculoskeletal: Negative.   Skin: Negative.   Allergic/Immunologic: Negative.   Neurological: Negative.   Hematological: Negative.   Psychiatric/Behavioral: Negative.   All other systems reviewed and are negative.    Objective:   Vitals:   04/28/20 0827  BP: 132/80  Pulse: 98  Resp: 16  Temp: 98.2 F (36.8 C)  SpO2: 97%  Weight: 165 lb 12.8 oz (75.2 kg)  Height: 5\' 5"  (1.651 m)    Body mass index is  27.59 kg/m.  Physical Exam Vitals and nursing note reviewed.  Constitutional:      General: She is not in acute distress.    Appearance: Normal appearance. She is well-developed. She is not ill-appearing, toxic-appearing or diaphoretic.     Interventions: Face mask in place.  HENT:     Head: Normocephalic and atraumatic.     Right Ear: External ear normal.     Left Ear: External ear normal.  Eyes:     General: Lids are normal. No scleral icterus.  Right eye: No discharge.        Left eye: No discharge.     Conjunctiva/sclera: Conjunctivae normal.  Neck:     Trachea: Phonation normal. No tracheal deviation.  Cardiovascular:     Rate and Rhythm: Normal rate and regular rhythm.     Pulses: Normal pulses.          Radial pulses are 2+ on the right side and 2+ on the left side.       Posterior tibial pulses are 2+ on the right side and 2+ on the left side.     Heart sounds: Normal heart sounds. No murmur heard. No friction rub. No gallop.   Pulmonary:     Effort: Pulmonary effort is normal. No respiratory distress.     Breath sounds: Normal breath sounds. No stridor. No wheezing, rhonchi or rales.  Chest:     Chest wall: No tenderness.  Abdominal:     General: Bowel sounds are normal. There is no distension.     Palpations: Abdomen is soft.  Musculoskeletal:     Right lower leg: No edema.     Left lower leg: No edema.  Skin:    General: Skin is warm and dry.     Coloration: Skin is not jaundiced or pale.     Findings: No rash.  Neurological:     Mental Status: She is alert.     Motor: No abnormal muscle tone.     Gait: Gait normal.  Psychiatric:        Mood and Affect: Mood normal.        Speech: Speech normal.        Behavior: Behavior normal.         Assessment & Plan:   1. Major depressive disorder in remission, unspecified whether recurrent [facility]) Depression screen [facility] 2/9 04/28/2020 10/18/2019 03/23/2019  Decreased Interest 3 0 2  Down, Depressed, Hopeless  1 0 1  PHQ - 2 Score 4 0 3  Altered sleeping 3 1 3   Tired, decreased energy 3 1 3   Change in appetite 3 0 0  Feeling bad or failure about yourself  1 0 2  Trouble concentrating 0 0 0  Moving slowly or fidgety/restless 1 0 0  Suicidal thoughts 0 0 0  PHQ-9 Score 15 2 11   Difficult doing work/chores - Not difficult at all Not difficult at all  Some recent data might be hidden   On venlafaxine and seroquel, mood worse - likely multifactorial (hypothyroid) For now will keep meds the same, no SI, HI, AVH, pt pleasant, good eye contact Explained that she will need psychiatry eval and mgmt if moods stay worse   2. Anxiety disorder, unspecified type See above GAD 7 : Generalized Anxiety Score 04/28/2020 02/19/2019  Nervous, Anxious, on Edge 3 2  Control/stop worrying 3 3  Worry too much - different things 3 2  Trouble relaxing 3 3  Restless 3 0  Easily annoyed or irritable 1 3  Afraid - awful might happen 0 2  Total GAD 7 Score 16 15  Anxiety Difficulty Very difficult Somewhat difficult   No improvement in sx- likely needs psych and therapy with her hx  3. Hypercholesterolemia Chang in meds from pravastatin to crestor 20, she is compliant, no SE or concerns, works on generally Lab Results  Component Value Date   CHOL 178 02/19/2019   HDL 47 (L) 02/19/2019   LDLCALC 106 (H) 02/19/2019   TRIG 142  02/19/2019   CHOLHDL 3.8 02/19/2019  due for lipid panel and CMP - Lipid panel - COMPLETE METABOLIC PANEL WITH GFR  4. Other specified hypothyroidism She feels chemically hypothyroid - weight gain, fatigue, sleepiness, napping, mood worse Lab Results  Component Value Date   TSH 3.56 02/19/2019  on 112 mcg levothyroxine, taking correctly, she has been on 125 mcg in the past Plan to increase meds if her labs are NOT abnormally low - and f/up in ~2 months - TSH  5. Hyperglycemia Labs in the past several times with high glucose, no past A1C, no past hx of DM or prediabetes  - get A1C today - pt unlikely to return for labs - she was very resistant to doing labs today - COMPLETE METABOLIC PANEL WITH GFR - Hemoglobin A1c  6. Encounter for hepatitis C screening test for low risk patient  - Hepatitis C Antibody  7. Screening for HIV without presence of risk factors  - HIV antibody (with reflex)  8. Encounter for screening mammogram for malignant neoplasm of breast  - MM 3D SCREEN BREAST BILATERAL; Future  9. Medication monitoring encounter  - TSH - Lipid panel - COMPLETE METABOLIC PANEL WITH GFR - CBC with Differential/Platelet - Hemoglobin A1c  10. Need for influenza vaccination  - Flu Vaccine QUAD 6+ mos PF IM (Fluarix Quad PF)   Return for CPE w/ thyroid f/up in the next 6 months (may be earlier depending on lab results).   Danelle BerryLeisa Taniyah Ballow, PA-C 04/28/20 9:00 AM

## 2020-04-29 LAB — CBC WITH DIFFERENTIAL/PLATELET
Absolute Monocytes: 612 cells/uL (ref 200–950)
Basophils Absolute: 51 cells/uL (ref 0–200)
Basophils Relative: 0.6 %
Eosinophils Absolute: 111 cells/uL (ref 15–500)
Eosinophils Relative: 1.3 %
HCT: 40.2 % (ref 35.0–45.0)
Hemoglobin: 13.6 g/dL (ref 11.7–15.5)
Lymphs Abs: 1505 cells/uL (ref 850–3900)
MCH: 32.2 pg (ref 27.0–33.0)
MCHC: 33.8 g/dL (ref 32.0–36.0)
MCV: 95 fL (ref 80.0–100.0)
MPV: 10 fL (ref 7.5–12.5)
Monocytes Relative: 7.2 %
Neutro Abs: 6222 cells/uL (ref 1500–7800)
Neutrophils Relative %: 73.2 %
Platelets: 225 10*3/uL (ref 140–400)
RBC: 4.23 10*6/uL (ref 3.80–5.10)
RDW: 12.5 % (ref 11.0–15.0)
Total Lymphocyte: 17.7 %
WBC: 8.5 10*3/uL (ref 3.8–10.8)

## 2020-04-29 LAB — COMPLETE METABOLIC PANEL WITH GFR
AG Ratio: 1.9 (calc) (ref 1.0–2.5)
ALT: 14 U/L (ref 6–29)
AST: 14 U/L (ref 10–35)
Albumin: 4.2 g/dL (ref 3.6–5.1)
Alkaline phosphatase (APISO): 79 U/L (ref 37–153)
BUN: 8 mg/dL (ref 7–25)
CO2: 28 mmol/L (ref 20–32)
Calcium: 9.4 mg/dL (ref 8.6–10.4)
Chloride: 106 mmol/L (ref 98–110)
Creat: 0.82 mg/dL (ref 0.50–1.05)
GFR, Est African American: 95 mL/min/{1.73_m2} (ref >=60)
GFR, Est Non African American: 82 mL/min/{1.73_m2} (ref >=60)
Globulin: 2.2 g/dL (calc) (ref 1.9–3.7)
Glucose, Bld: 93 mg/dL (ref 65–99)
Potassium: 4.3 mmol/L (ref 3.5–5.3)
Sodium: 141 mmol/L (ref 135–146)
Total Bilirubin: 0.3 mg/dL (ref 0.2–1.2)
Total Protein: 6.4 g/dL (ref 6.1–8.1)

## 2020-04-29 LAB — LIPID PANEL
Cholesterol: 178 mg/dL (ref <200)
HDL: 55 mg/dL (ref >=50)
LDL Cholesterol (Calc): 100 mg/dL (calc) — ABNORMAL HIGH
Non-HDL Cholesterol (Calc): 123 mg/dL (calc) (ref <130)
Total CHOL/HDL Ratio: 3.2 (calc) (ref <5.0)
Triglycerides: 130 mg/dL (ref <150)

## 2020-04-29 LAB — HIV ANTIBODY (ROUTINE TESTING W REFLEX): HIV 1&2 Ab, 4th Generation: NONREACTIVE

## 2020-04-29 LAB — HEPATITIS C ANTIBODY
Hepatitis C Ab: NONREACTIVE
SIGNAL TO CUT-OFF: 0.01 (ref <1.00)

## 2020-04-29 LAB — HEMOGLOBIN A1C
Hgb A1c MFr Bld: 5.8 % of total Hgb — ABNORMAL HIGH (ref <5.7)
Mean Plasma Glucose: 120 mg/dL
eAG (mmol/L): 6.6 mmol/L

## 2020-04-29 LAB — TSH: TSH: 32.56 mIU/L — ABNORMAL HIGH

## 2020-05-05 MED ORDER — LEVOTHYROXINE SODIUM 25 MCG PO TABS: ORAL_TABLET | ORAL | 0 refills | Status: DC

## 2020-05-05 NOTE — Progress Notes (Signed)
Called pt with lab results on TSH  Good med compliance with 112 mcg Chemically hypothyroid-  TSH is consistent with her sx noted at last OV Lab Results  Component Value Date   TSH 32.56 (H) 04/28/2020    Add 25 mcg to 112 mcg daily dose First add 25 mcg every other day for 1-2 weeks, then take 112 + 25 mcg daily - total 137 mcg - may plan to increase to 150 mcg daily depending on how she feels with it.   Asked her to check in with me in about a month with how the dose increase is feeling - reviewed sx of too much thyroid hormone - anxiety, insomnia, palpitations, hot flashes/hot intolerance, etc - pt verbalized understanding  Repeat TSH in 6 weeks   Meds ordered this encounter  Medications  . levothyroxine (SYNTHROID) 25 MCG tablet    Sig: Add 25 mcg tablet to 112 mcg tablet for total of 137 mcg dose PO daily in am before breakfast    Dispense:  60 tablet    Refill:  0    Dose change    Order Specific Question:   Supervising Provider    Answer:   Alba Cory [[id number]]    Labs reviewed verbally with pt by myself today Danelle Berry, PA-C

## 2020-05-05 NOTE — Addendum Note (Signed)
Addended by: Danelle Berry on: 05/05/2020 05:02 PM   Modules accepted: Orders

## 2020-05-16 ENCOUNTER — Other Ambulatory Visit: Payer: Self-pay | Admitting: Family Medicine

## 2020-05-16 DIAGNOSIS — F605 Obsessive-compulsive personality disorder: Secondary | ICD-10-CM

## 2020-05-16 DIAGNOSIS — F331 Major depressive disorder, recurrent, moderate: Secondary | ICD-10-CM

## 2020-05-16 DIAGNOSIS — F419 Anxiety disorder, unspecified: Secondary | ICD-10-CM

## 2020-07-02 ENCOUNTER — Other Ambulatory Visit: Payer: Self-pay | Admitting: Family Medicine

## 2020-07-02 DIAGNOSIS — E038 Other specified hypothyroidism: Secondary | ICD-10-CM

## 2020-07-02 NOTE — Telephone Encounter (Signed)
Last appt:2/14 Next:8/16

## 2020-07-02 NOTE — Telephone Encounter (Signed)
Pt.notified

## 2020-07-15 ENCOUNTER — Encounter: Payer: Self-pay | Admitting: Family Medicine

## 2020-07-23 LAB — TSH: TSH: 2.19 mIU/L

## 2020-07-24 MED ORDER — LEVOTHYROXINE SODIUM 25 MCG PO TABS: ORAL_TABLET | ORAL | 0 refills | Status: DC

## 2020-07-24 NOTE — Addendum Note (Signed)
Addended by: Danelle Berry on: 07/24/2020 10:15 AM   Modules accepted: Orders

## 2020-08-01 IMAGING — DX PORTABLE CHEST - 1 VIEW
1 series · 1 of 1 positions shown · non-contrast
Comparison: 02/03/2018

CLINICAL DATA: Shortness of breath

EXAM:
PORTABLE CHEST 1 VIEW

[chest ap]
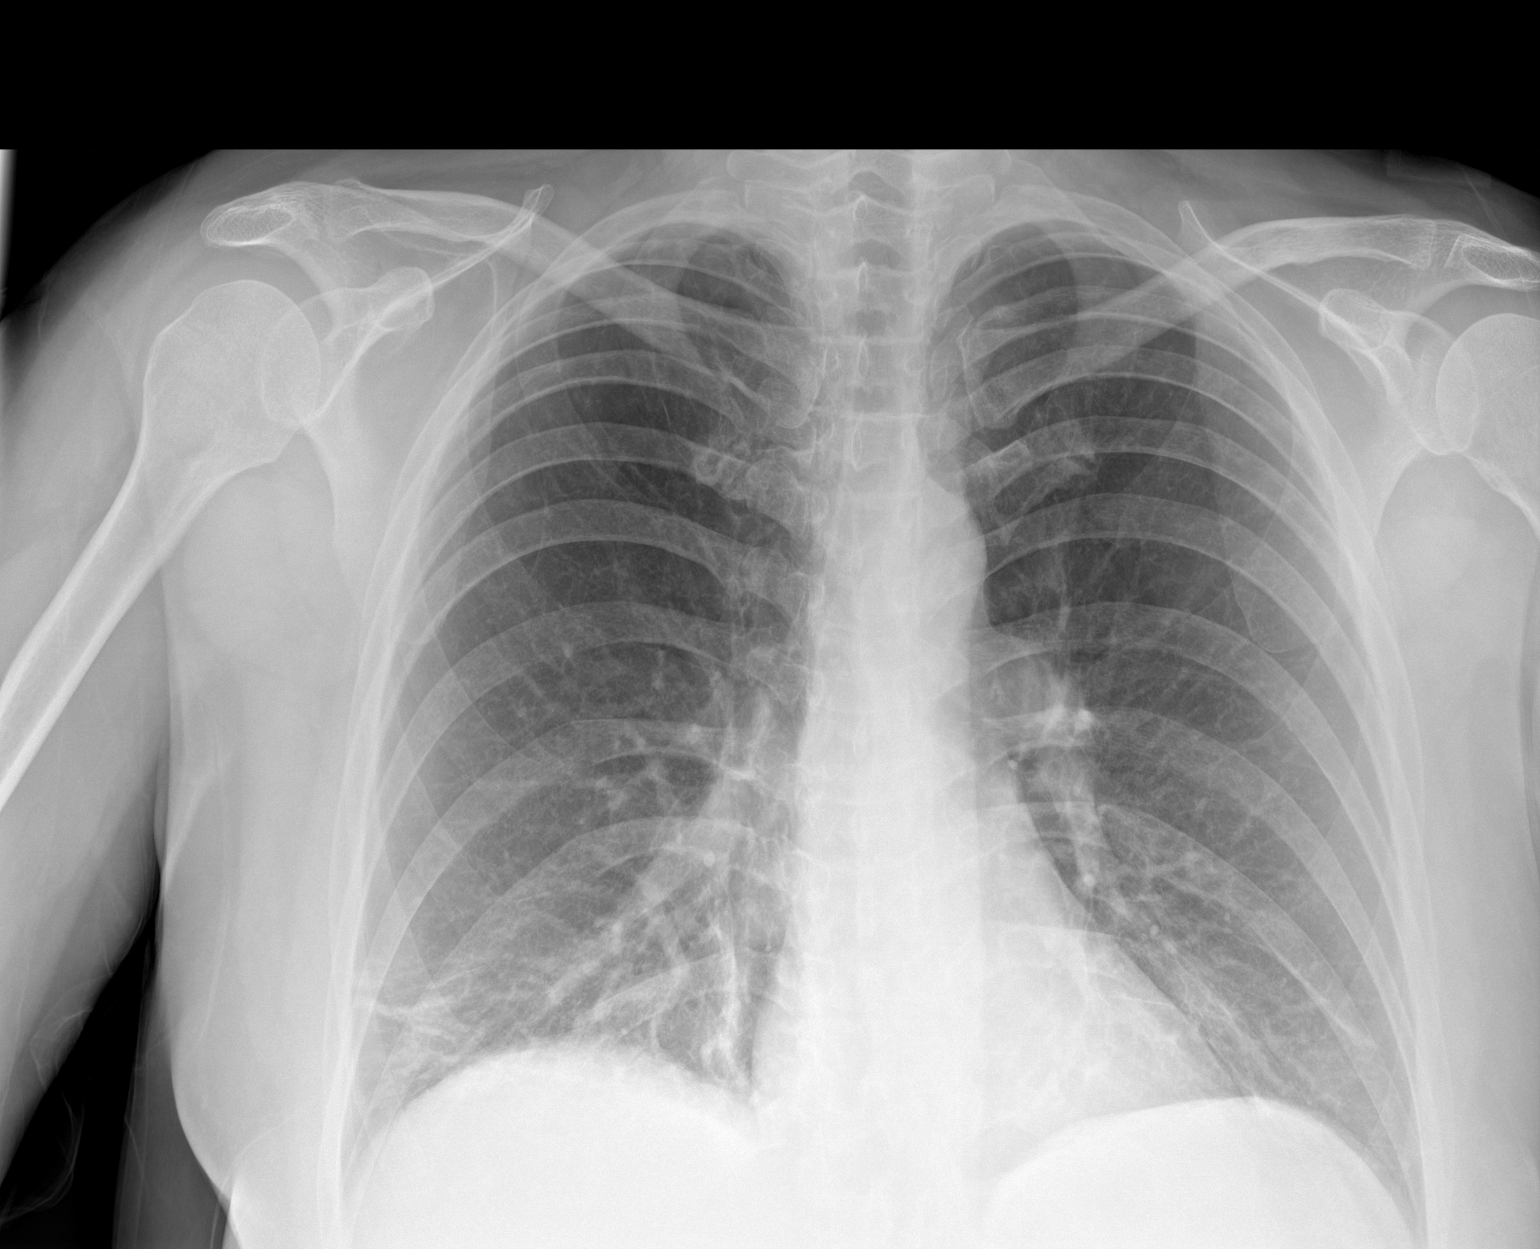

[1 of 1 positions shown; findings below may reference images not displayed]

FINDINGS: There is hazy right lower lobe airspace disease. There is no focal
consolidation. There is no pleural effusion or pneumothorax. The
heart and mediastinal contours are unremarkable.

The osseous structures are unremarkable.
IMPRESSION: Hazy right lower lobe airspace disease which may reflect atelectasis
versus pneumonia.

## 2020-09-12 IMAGING — CT CT ANGIOGRAPHY CHEST
1 of 6 series · 19 of 36 positions shown · IV contrast (omnipaque)
Comparison: None.

CLINICAL DATA: PE suspected. Shortness of breath. Substernal and
left-sided chest pain.

EXAM:
CT ANGIOGRAPHY CHEST WITH CONTRAST
TECHNIQUE: Multidetector CT imaging of the chest was performed using the
standard protocol during bolus administration of intravenous
contrast. Multiplanar CT image reconstructions and MIPs were
obtained to evaluate the vascular anatomy.
CONTRAST:  75mL OMNIPAQUE IOHEXOL 350 MG/ML SOLN

[Series 6: thins · axial · 0.68mm/px · z∈[-473,-243]mm · 19 of 256 slices shown]
[im 13/256  lung]
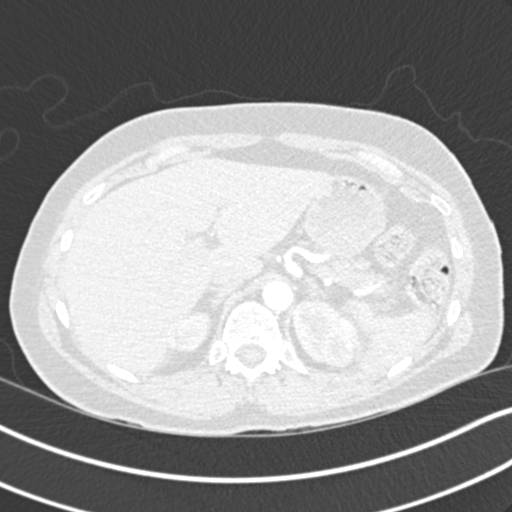
[im 26/256  mediastinal]
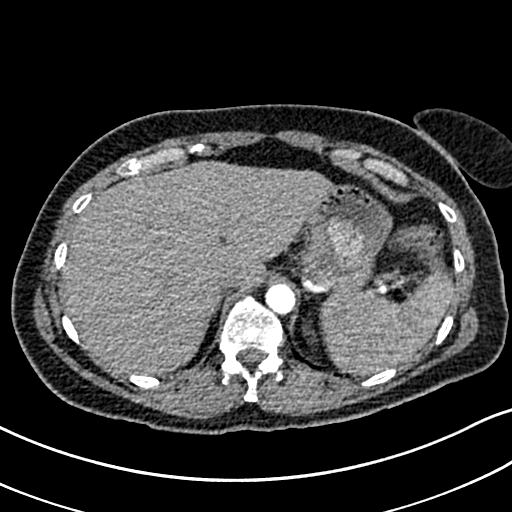
[im 39/256  lung]
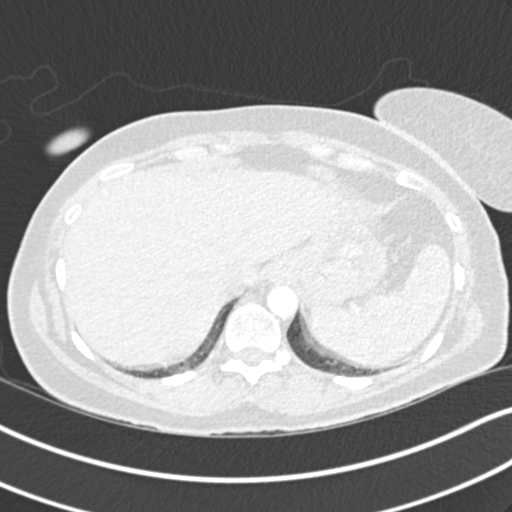
[im 52/256  mediastinal]
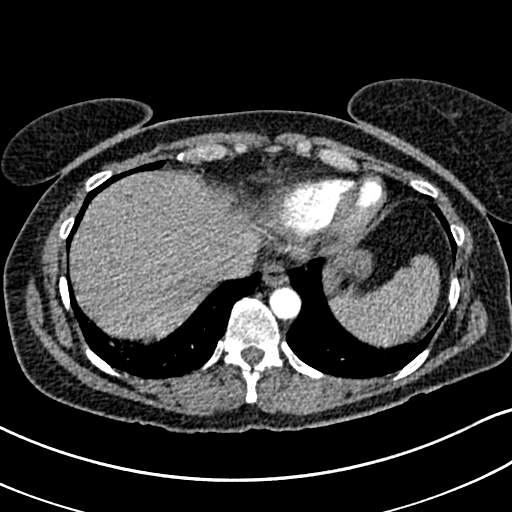
[im 64/256  lung]
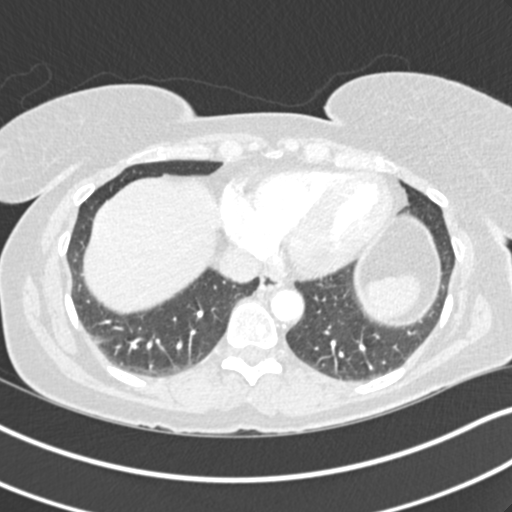
[im 77/256  mediastinal]
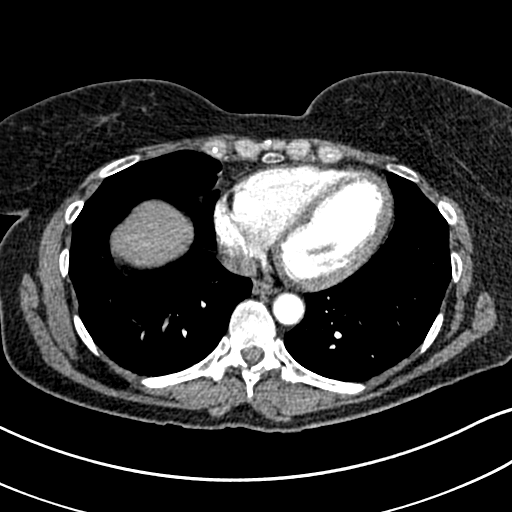
[im 90/256  lung]
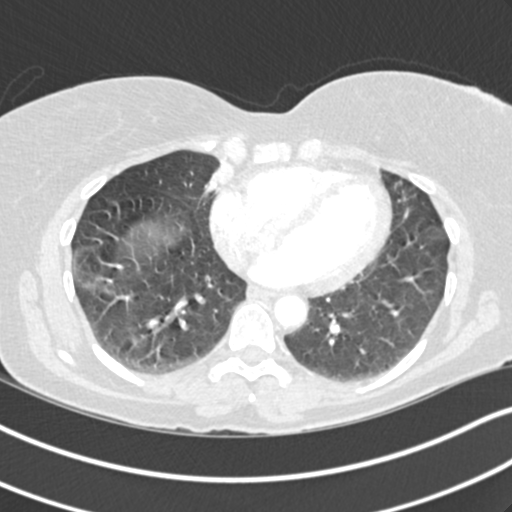
[im 103/256  mediastinal]
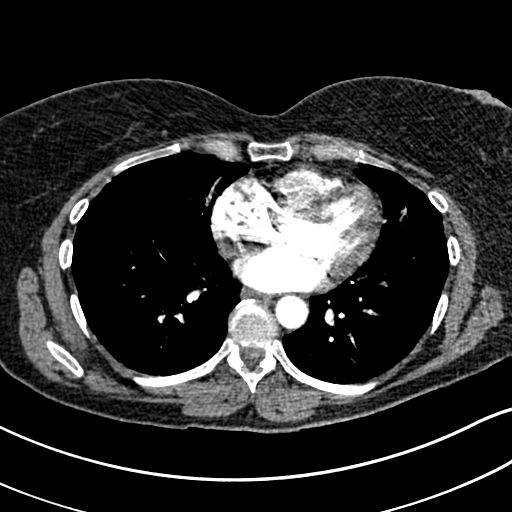
[im 115/256  lung]
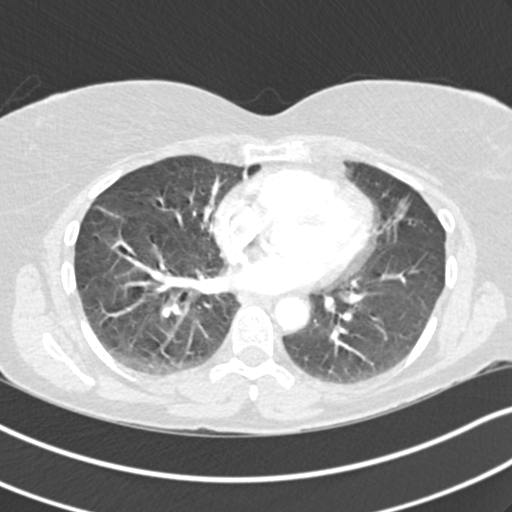
[im 128/256  mediastinal]
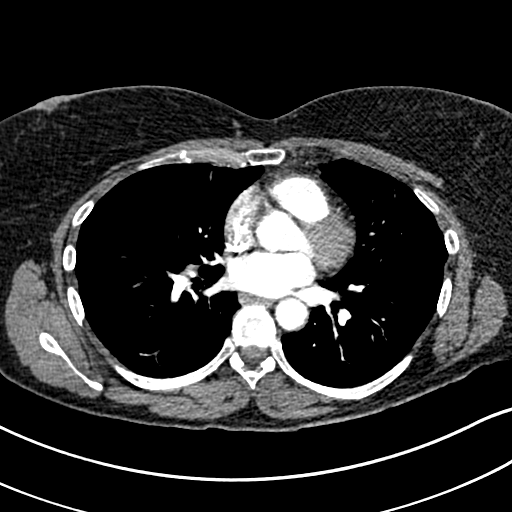
[im 141/256  lung]
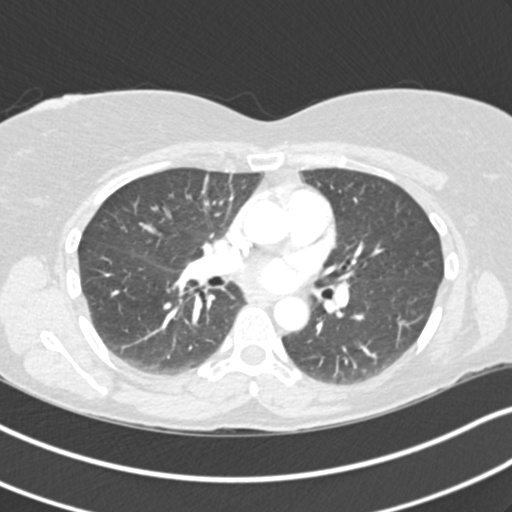
[im 154/256  mediastinal]
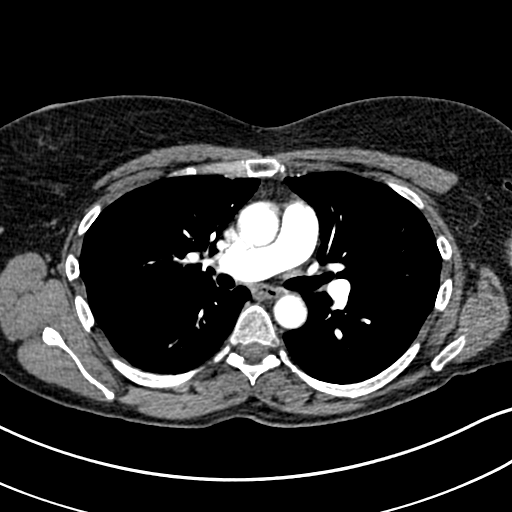
[im 166/256  lung]
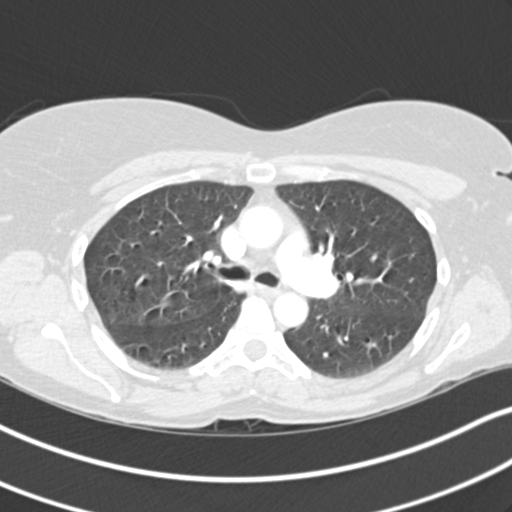
[im 179/256  mediastinal]
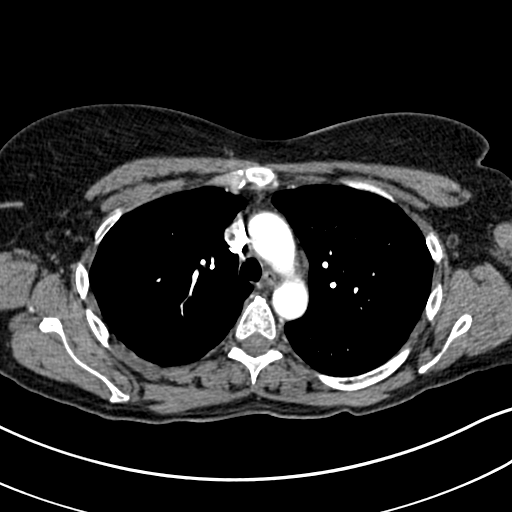
[im 192/256  lung]
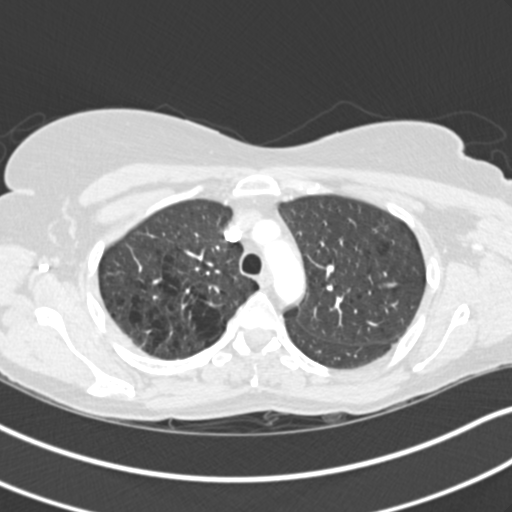
[im 205/256  mediastinal]
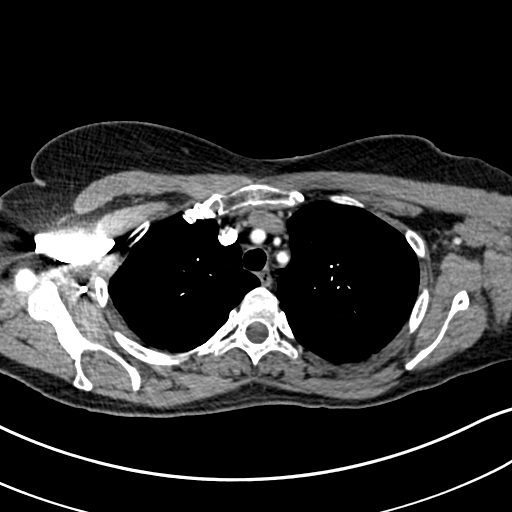
[im 217/256  lung]
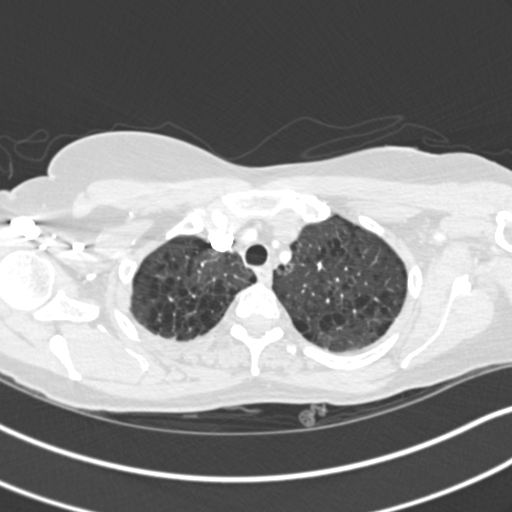
[im 230/256  mediastinal]
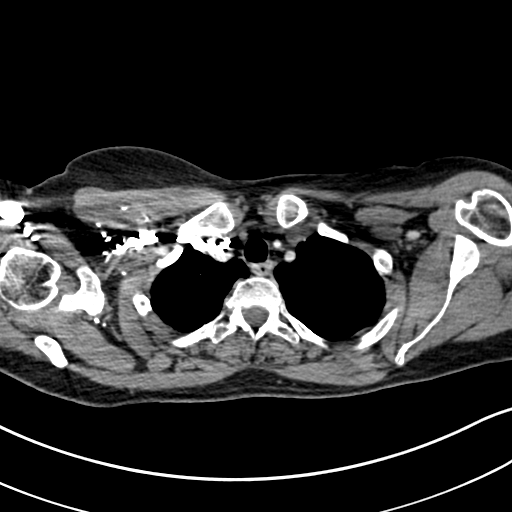
[im 243/256  lung]
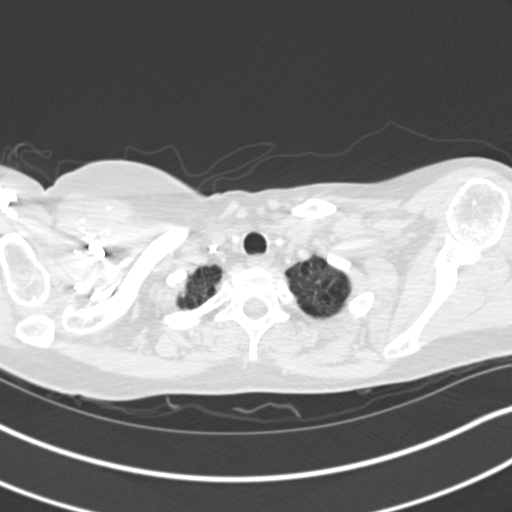

[19 of 36 positions shown; findings below may reference images not displayed]

FINDINGS: Cardiovascular: The heart is not significantly enlarged. There is no
pericardial effusion. Evaluation for pulmonary emboli is somewhat
limited by respiratory motion artifact. There is no large centrally
located pulmonary embolus. Detection of segmental and subsegmental
pulmonary emboli is severely limited.

Mediastinum/Nodes: There are no pathologically enlarged mediastinal,
hilar, axillary, or supraclavicular lymph nodes. No distinct thyroid
mass. The esophagus is grossly unremarkable.

Lungs/Pleura: Moderate emphysematous changes are noted, primarily in
the upper lobes. Linear airspace opacities are noted in the lingula
and right middle lobe. There is no pneumothorax. There is no large
pleural effusion.

Upper Abdomen: There are few small hypoattenuating areas within the
right hepatic lobe, too small to characterize but are favored to
represent benign hepatic cysts.

Musculoskeletal: No chest wall abnormality. No acute or significant
osseous findings.

Review of the MIP images confirms the above findings.
IMPRESSION: 1. Examination limited by motion artifact.
2. Given the limitations described above, there is no acute
pulmonary embolus. Detection of segmental and subsegmental pulmonary
emboli is severely limited by the motion artifact.
3. Atelectasis in the right middle lobe and lingula, otherwise the
lungs are clear.
4. Emphysematous changes bilaterally.

Emphysema ([id number]).

## 2020-09-28 ENCOUNTER — Encounter: Payer: Self-pay | Admitting: Family Medicine

## 2020-09-29 ENCOUNTER — Other Ambulatory Visit: Payer: Self-pay

## 2020-09-29 DIAGNOSIS — F605 Obsessive-compulsive personality disorder: Secondary | ICD-10-CM

## 2020-09-29 DIAGNOSIS — F419 Anxiety disorder, unspecified: Secondary | ICD-10-CM

## 2020-09-29 DIAGNOSIS — F331 Major depressive disorder, recurrent, moderate: Secondary | ICD-10-CM

## 2020-09-29 MED ORDER — VENLAFAXINE HCL ER 75 MG PO CP24: 225.0000 mg | ORAL_CAPSULE | Freq: Every day | ORAL | 1 refills | Status: DC

## 2020-10-13 ENCOUNTER — Encounter: Payer: Self-pay | Admitting: Family Medicine

## 2020-10-27 ENCOUNTER — Encounter: Payer: Self-pay | Admitting: Family Medicine

## 2020-10-27 NOTE — Progress Notes (Deleted)
Patient: Charlotte Thompson, Female    DOB: Oct 21, 1965, 55 y.o.   MRN: [id number] Delsa Grana, PA-C Visit Date: 10/27/2020  Today's Provider: Delsa Grana, PA-C   No chief complaint on file.  Subjective:   Annual physical exam:  Charlotte Thompson is a 55 y.o. female who presents today for complete physical exam:  Exercise/Activity: Diet/nutrition: Sleep:   SDOH Screenings   Alcohol Screen: Low Risk    Last Alcohol Screening Score (AUDIT): 0  Depression (PHQ2-9): Medium Risk   PHQ-2 Score: 15  Financial Resource Strain: Not on file  Food Insecurity: Not on file  Housing: Not on file  Physical Activity: Not on file  Social Connections: Not on file  Stress: Not on file  Tobacco Use: High Risk   Smoking Tobacco Use: Every Day   Smokeless Tobacco Use: Never  Transportation Needs: Not on file     Pt wished  *** discuss acute complaints  *** do routine f/up on chronic conditions today in addition to CPE. Advised pt of separate visit billing/coding  USPSTF grade A and B recommendations - reviewed and addressed today  Depression:  Phq 9 completed today by patient, was reviewed by me with patient in the room PHQ score is ***, pt feels *** PHQ 2/9 Scores 04/28/2020 10/18/2019 03/23/2019 02/19/2019  PHQ - 2 Score 4 0 3 6  PHQ- 9 Score $Remov'15 2 11 18   'mweoah$ Depression screen [facility] 2/9 04/28/2020 10/18/2019 03/23/2019 02/19/2019 07/21/2018  Decreased Interest 3 0 2 3 0  Down, Depressed, Hopeless 1 0 1 3 0  PHQ - 2 Score 4 0 3 6 0  Altered sleeping $RemoveBeforeDE'3 1 3 3 'nktPYtPgRkhlneO$ 0  Tired, decreased energy $RemoveBeforeDE'3 1 3 2 'WyGmYygFmflErAq$ 0  Change in appetite 3 0 0 2 0  Feeling bad or failure about yourself  1 0 2 3 0  Trouble concentrating 0 0 0 1 0  Moving slowly or fidgety/restless 1 0 0 0 0  Suicidal thoughts 0 0 0 1 0  PHQ-9 Score $RemoveBef'15 2 11 18 'TJtZMbXXFx$ 0  Difficult doing work/chores - Not difficult at all Not difficult at all Somewhat difficult Not difficult at all  Some recent data might be hidden    Alcohol screening: [place]  Visit from 04/28/2020 in [facility]  AUDIT-C Score 0       Immunizations and Health Maintenance: Health Maintenance  Topic Date Due   Pneumococcal Vaccine 65-2 Years old (1 - PCV) Never done   Zoster Vaccines- Shingrix (1 of 2) Never done   COLONOSCOPY (Pts 45-67yrs Insurance coverage will need to be confirmed)  Never done   MAMMOGRAM  09/28/2017   PAP SMEAR-Modifier  07/14/2018   COVID-19 Vaccine (4 - Booster for Moderna series) 06/21/2020   INFLUENZA VACCINE  10/13/2020   TETANUS/TDAP  06/30/2026   Hepatitis C Screening  Completed   HIV Screening  Completed   HPV VACCINES  Aged Out     Hep C Screening: ***  STD testing and prevention (HIV/chl/gon/syphilis):  see above, no additional testing desired by pt today  Intimate partner violence:***  Sexual History/Pain during Intercourse: Married  Menstrual History/LMP/Abnormal Bleeding: *** No LMP recorded. Patient has had an ablation.  Incontinence Symptoms: ***  Breast cancer:  Last Mammogram: *see HM list above BRCA gene screening: ***  Cervical cancer screening: *** Pt *** family hx of cancers - breast, ovarian, uterine, colon:     Osteoporosis:   Discussion on osteoporosis per age,  including high calcium and vitamin D supplementation, weight bearing exercises Pt is *** supplementing with daily calcium/Vit D. *** Bone scan/dexa Roughly experienced menopause at age ***  Skin cancer:  Hx of skin CA -  NO*** Discussed atypical lesions   Colorectal cancer:   Colonoscopy is ***   Discussed concerning signs and sx of CRC, pt denies ***  Lung cancer:   Low Dose CT Chest recommended if Age 73-80 years, 20 pack-year currently smoking OR have quit w/in 15years. Patient {DOES NOT does:27190::"does not"} qualify.    Social History   Tobacco Use   Smoking status: Every Day    Years: 16.00    Types: Cigarettes    Start date: 03/24/2014   Smokeless tobacco: Never  Vaping Use   Vaping Use: Never  used  Substance Use Topics   Alcohol use: No    Alcohol/week: 0.0 standard drinks   Drug use: No     Flowsheet Row Office Visit from [facility]/28/[facility] in [facility]  AUDIT-C Score 0       Family History  Problem Relation Age of Onset   Autoimmune disease Father    Kidney failure Father    Heart disease Father    Heart attack Father    Hypertension Father    Hyperlipidemia Father    Clotting disorder Mother    Kidney disease Mother    Heart attack Mother    Thyroid disease Sister    Cancer Sister        breast   Breast cancer Sister 36   Thyroid disease Daughter    Diabetes Paternal Grandmother    Diabetes type II Paternal Grandmother    Hypertension Daughter    Hearing loss Maternal Grandmother    Cancer Maternal Grandmother        colon   Cancer Maternal Grandfather        prostate   Pneumonia Paternal Grandfather    Cancer Paternal Grandfather        bone     Blood pressure/Hypertension: BP Readings from Last 3 Encounters:  04/28/20 132/80  10/18/19 118/84  02/19/19 134/82    Weight/Obesity: Wt Readings from Last 3 Encounters:  04/28/20 165 lb 12.8 oz (75.2 kg)  10/18/19 153 lb 11.2 oz (69.7 kg)  03/23/19 143 lb (64.9 kg)   BMI Readings from Last 3 Encounters:  04/28/20 27.59 kg/m  10/18/19 25.58 kg/m  03/23/19 23.43 kg/m     Lipids:  Lab Results  Component Value Date   CHOL 178 04/28/2020   CHOL 178 02/19/2019   CHOL 173 07/19/2018   Lab Results  Component Value Date   HDL 55 04/28/2020   HDL 47 (L) 02/19/2019   HDL 45 (L) 07/19/2018   Lab Results  Component Value Date   LDLCALC 100 (H) 04/28/2020   LDLCALC 106 (H) 02/19/2019   LDLCALC 104 (H) 07/19/2018   Lab Results  Component Value Date   TRIG 130 04/28/2020   TRIG 142 02/19/2019   TRIG 141 07/19/2018   Lab Results  Component Value Date   CHOLHDL 3.2 04/28/2020   CHOLHDL 3.8 02/19/2019   CHOLHDL 3.8 07/19/2018   No results found for: LDLDIRECT Based on  the results of lipid panel his/her cardiovascular risk factor ( using [place] )  in the next 10 years is: The 10-year ASCVD risk score Mikey Bussing DC Jr., et al., 2013) is: 3.9%   Values used to calculate the score:     Age: 20 years  Sex: Female     Is Non-Hispanic African American: No     Diabetic: No     Tobacco smoker: Yes     Systolic Blood Pressure: 196 mmHg     Is BP treated: No     HDL Cholesterol: 55 mg/dL     Total Cholesterol: 178 mg/dL  Glucose:  Glucose, Bld  Date Value Ref Range Status  04/28/2020 93 65 - 99 mg/dL Final    Comment:    .            Fasting reference interval .   02/19/2019 101 (H) 65 - 99 mg/dL Final    Comment:    .            Fasting reference interval . For someone without known diabetes, a glucose value between 100 and 125 mg/dL is consistent with prediabetes and should be confirmed with a follow-up test. .   07/24/2018 125 (H) 70 - 99 mg/dL Final    Advanced Care Planning:  A voluntary discussion about advance care planning including the explanation and discussion of advance directives.   Discussed health care proxy and Living will, and the patient was able to identify a health care proxy as ***.   Patient {DOES_DOES QIW:97989} have a living will at present time.   Social History       Social History   Socioeconomic History   Marital status: Married    Spouse name: Jaquelyn Bitter   Number of children: Not on file   Years of education: Not on file   Highest education level: Not on file  Occupational History   Not on file  Tobacco Use   Smoking status: Every Day    Years: 16.00    Types: Cigarettes    Start date: 03/24/2014   Smokeless tobacco: Never  Vaping Use   Vaping Use: Never used  Substance and Sexual Activity   Alcohol use: No    Alcohol/week: 0.0 standard drinks   Drug use: No   Sexual activity: Not Currently  Other Topics Concern   Not on file  Social History Narrative   Not on file   Social Determinants of  Health   Financial Resource Strain: Not on file  Food Insecurity: Not on file  Transportation Needs: Not on file  Physical Activity: Not on file  Stress: Not on file  Social Connections: Not on file    Family History        Family History  Problem Relation Age of Onset   Autoimmune disease Father    Kidney failure Father    Heart disease Father    Heart attack Father    Hypertension Father    Hyperlipidemia Father    Clotting disorder Mother    Kidney disease Mother    Heart attack Mother    Thyroid disease Sister    Cancer Sister        breast   Breast cancer Sister 46   Thyroid disease Daughter    Diabetes Paternal Grandmother    Diabetes type II Paternal Grandmother    Hypertension Daughter    Hearing loss Maternal Grandmother    Cancer Maternal Grandmother        colon   Cancer Maternal Grandfather        prostate   Pneumonia Paternal Grandfather    Cancer Paternal Grandfather        bone    Patient Active Problem List   Diagnosis Date Noted   Major  depressive disorder in remission ([place]) 04/28/2020   Anxiety disorder 04/28/2020   Major depressive disorder, recurrent ([place]) 12/06/2017   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[facility]) 10/07/2016   Prolonged grief reaction 09/23/2016   Aggressive behavior 06/29/2016   Fatigue 06/29/2016   Vitamin D deficiency 06/29/2016   Vitamin B12 deficiency 06/29/2016   Microalbuminuria 12/27/2015   Insomnia 12/27/2015   Anxiety and depression 12/05/2015   Irritability 06/21/2015   Hypothyroidism 06/16/2015   Tobacco abuse 06/16/2015   Hypercholesterolemia 06/16/2015   Medication monitoring encounter 06/16/2015   Family history of chronic renal disease 06/16/2015   Hx of iron deficiency anemia 06/16/2015   Heart murmur 06/16/2015   Benign cyst of skin 10/18/2014   Hot flashes, menopausal 09/02/2014    Past Surgical History:  Procedure Laterality Date   COLONOSCOPY     PARTIAL HYSTERECTOMY  2010      Current Outpatient Medications:    docusate sodium (COLACE) 50 MG capsule, Take 50 mg by mouth daily as needed for mild constipation., Disp: , Rfl:    levothyroxine (SYNTHROID) 112 MCG tablet, Take 1 tablet (112 mcg total) by mouth daily., Disp: 90 tablet, Rfl: 3   levothyroxine (SYNTHROID) 25 MCG tablet, Add 25 mcg tablet to 112 mcg tablet for total of 137 mcg dose PO daily in am before breakfast, Disp: 90 tablet, Rfl: 0   QUEtiapine (SEROQUEL) 50 MG tablet, TAKE 1 TABLET BY MOUTH EVERYDAY AT BEDTIME, Disp: 90 tablet, Rfl: 1   rosuvastatin (CRESTOR) 20 MG tablet, Take 1 tablet (20 mg total) by mouth daily., Disp: 90 tablet, Rfl: 3   venlafaxine XR (EFFEXOR-XR) 75 MG 24 hr capsule, Take 3 capsules (225 mg total) by mouth daily with breakfast., Disp: 270 capsule, Rfl: 1  Allergies  Allergen Reactions   Aspirin Nausea And Vomiting   Codeine Hives    Patient Care Team: Delsa Grana, PA-C as PCP - General (Family Medicine) Christene Lye, MD (General Surgery) P.A., Cletis Athens, MD as Referring Physician (Cardiology)   Chart Review: ***  Review of Systems        Objective:   Vitals:  There were no vitals filed for this visit.  There is no height or weight on file to calculate BMI.  Physical Exam    Fall Risk: Fall Risk  04/28/2020 10/18/2019 03/23/2019 02/19/2019 07/21/2018  Falls in the past year? 0 0 0 0 0  Number falls in past yr: 0 0 0 0 0  Injury with Fall? 0 0 0 0 0  Follow up - - - - -    Functional Status Survey:     Assessment & Plan:    CPE completed today  USPSTF grade A and B recommendations reviewed with patient; age-appropriate recommendations, preventive care, screening tests, etc discussed and encouraged; healthy living encouraged; see AVS for patient education given to patient  Discussed importance of 150 minutes of physical activity weekly, AHA exercise recommendations given to pt in AVS/handout  Discussed importance of healthy diet:   eating lean meats and proteins, avoiding trans fats and saturated fats, avoid simple sugars and excessive carbs in diet, eat 6 servings of fruit/vegetables daily and drink plenty of water and avoid sweet beverages.    Recommended pt to do annual eye exam and routine dental exams/cleanings  Depression, alcohol, fall screening completed as documented above and per flowsheets  Advance Care planning information and packet discussed and offered today, encouraged pt to discuss with family members/spouse/partner/friends and complete Advanced directive packet and  bring copy to office   Reviewed Health Maintenance: Health Maintenance  Topic Date Due   Pneumococcal Vaccine 81-10 Years old (1 - PCV) Never done   Zoster Vaccines- Shingrix (1 of 2) Never done   COLONOSCOPY (Pts 45-82yrs Insurance coverage will need to be confirmed)  Never done   MAMMOGRAM  09/28/2017   PAP SMEAR-Modifier  07/14/2018   COVID-19 Vaccine (4 - Booster for Moderna series) 06/21/2020   INFLUENZA VACCINE  10/13/2020   TETANUS/TDAP  06/30/2026   Hepatitis C Screening  Completed   HIV Screening  Completed   HPV VACCINES  Aged Out    Immunizations: Immunization History  Administered Date(s) Administered   Influenza,inj,Quad PF,6+ Mos 11/17/2016, 12/06/2017, 02/19/2019, 04/28/2020   Moderna Sars-Covid-2 Vaccination 08/04/2019, 09/01/2019, 03/23/2020   Tdap 06/29/2016   Vaccines:  HPV: up to at age 44 , ask insurance if age between 46-45  Shingrix: 67-64 yo and ask insurance if covered when patient above 72 yo Pneumonia: *** educated and discussed with patient. Flu: *** educated and discussed with patient. COVID:      ICD-10-CM   1. Adult general medical exam  Z00.00     2. Encounter for medication monitoring  Z51.81     3. Other specified hypothyroidism  E03.8           Delsa Grana, PA-C 10/27/20 9:18 AM  [facility]

## 2020-10-28 ENCOUNTER — Encounter: Payer: No Typology Code available for payment source | Admitting: Family Medicine

## 2020-11-11 ENCOUNTER — Encounter: Payer: Self-pay | Admitting: Family Medicine

## 2020-11-11 ENCOUNTER — Ambulatory Visit (INDEPENDENT_AMBULATORY_CARE_PROVIDER_SITE_OTHER): Payer: No Typology Code available for payment source | Admitting: Family Medicine

## 2020-11-11 ENCOUNTER — Other Ambulatory Visit (HOSPITAL_COMMUNITY)
Admission: RE | Admit: 2020-11-11 | Discharge: 2020-11-11 | Disposition: A | Discharge status: Home / Self Care | Payer: No Typology Code available for payment source | Source: Ambulatory Visit | Attending: Family Medicine | Admitting: Family Medicine

## 2020-11-11 ENCOUNTER — Other Ambulatory Visit: Payer: Self-pay

## 2020-11-11 VITALS — BP 132/82 | HR 91 | Temp 98.2°F | Ht 65.0 in | Wt 169.4 lb

## 2020-11-11 DIAGNOSIS — R7303 Prediabetes: Secondary | ICD-10-CM | POA: Diagnosis not present

## 2020-11-11 DIAGNOSIS — Z720 Tobacco use: Secondary | ICD-10-CM

## 2020-11-11 DIAGNOSIS — F605 Obsessive-compulsive personality disorder: Secondary | ICD-10-CM

## 2020-11-11 DIAGNOSIS — E7800 Pure hypercholesterolemia, unspecified: Secondary | ICD-10-CM

## 2020-11-11 DIAGNOSIS — E663 Overweight: Secondary | ICD-10-CM | POA: Diagnosis not present

## 2020-11-11 DIAGNOSIS — Z124 Encounter for screening for malignant neoplasm of cervix: Secondary | ICD-10-CM

## 2020-11-11 DIAGNOSIS — Z0000 Encounter for general adult medical examination without abnormal findings: Secondary | ICD-10-CM

## 2020-11-11 DIAGNOSIS — F32A Depression, unspecified: Secondary | ICD-10-CM

## 2020-11-11 DIAGNOSIS — E538 Deficiency of other specified B group vitamins: Secondary | ICD-10-CM | POA: Diagnosis not present

## 2020-11-11 DIAGNOSIS — E038 Other specified hypothyroidism: Secondary | ICD-10-CM

## 2020-11-11 DIAGNOSIS — Z1211 Encounter for screening for malignant neoplasm of colon: Secondary | ICD-10-CM | POA: Diagnosis not present

## 2020-11-11 DIAGNOSIS — Z23 Encounter for immunization: Secondary | ICD-10-CM

## 2020-11-11 DIAGNOSIS — F331 Major depressive disorder, recurrent, moderate: Secondary | ICD-10-CM

## 2020-11-11 DIAGNOSIS — F419 Anxiety disorder, unspecified: Secondary | ICD-10-CM

## 2020-11-11 MED ORDER — VENLAFAXINE HCL ER 75 MG PO CP24: 225.0000 mg | ORAL_CAPSULE | Freq: Every day | ORAL | 1 refills | Status: DC

## 2020-11-11 MED ORDER — ROSUVASTATIN CALCIUM 20 MG PO TABS: 20.0000 mg | ORAL_TABLET | Freq: Every day | ORAL | 3 refills | Status: DC

## 2020-11-11 MED ORDER — LEVOTHYROXINE SODIUM 112 MCG PO TABS: 112.0000 ug | ORAL_TABLET | Freq: Every day | ORAL | 3 refills | Status: DC

## 2020-11-11 NOTE — Assessment & Plan Note (Signed)
May be contributing to worsened depression and difficulties sleeping, recheck labs today.

## 2020-11-11 NOTE — Assessment & Plan Note (Signed)
Recheck labs 

## 2020-11-11 NOTE — Patient Instructions (Addendum)
It was great to see you!  Our plans for today:  - Call to schedule your bone density and mammogram.  - You received your pneumococcal and flu vaccines today.  - Check with your insurance about the shingles vaccine. You can also get this from your pharmacy. - We ordered your Cologuard for colon cancer screening, you will receive a packet in the mail with instructions. - Review the information on advanced directives.  - We referred you to a weight management program, let us know if you don't hear about an appointment in the next few weeks.  - Come back in 4 weeks.  We are checking some labs today, we will release these results to your MyChart.  Take care and seek immediate care sooner if you develop any concerns.   Dr. Linwood Dibbles  Things to do to keep yourself healthy  - Exercise at least 30-45 minutes a day, 3-4 days a week.  - Eat a low-fat diet with lots of fruits and vegetables, up to 7-9 servings per day.  - Seatbelts can save your life. Wear them always.  - Smoke detectors on every level of your home, check batteries every year.  - Eye Doctor - have an eye exam every 1-2 years  - Safe sex - if you may be exposed to STDs, use a condom.  - Alcohol -  If you drink, do it moderately, less than 2 drinks per day.  - Health Care Power of Attorney. Choose someone to speak for you if you are not able. https://www.prepareforyourcare.org is a great website to help you navigate this. - Depression is common in our stressful world.If you're feeling down or losing interest in things you normally enjoy, please come in for a visit.  - Violence - If anyone is threatening or hurting you, please call immediately.

## 2020-11-11 NOTE — Assessment & Plan Note (Signed)
Tolerant of statin, last labs wnl.

## 2020-11-11 NOTE — Addendum Note (Signed)
Addended by: Forde Radon on: 11/11/2020 09:28 AM   Modules accepted: Orders

## 2020-11-11 NOTE — Progress Notes (Signed)
BP 132/82   Pulse 91   Temp 98.2 F (36.8 C)   Ht 5\' 5"  (1.651 m)   Wt 169 lb 6.4 oz (76.8 kg)   SpO2 96%   BMI 28.19 kg/m    Subjective:    Patient ID: Charlotte Thompson, female    DOB: 10-29-1965, 55 y.o.   MRN: [id number]  HPI: Charlotte Thompson is a 55 y.o. female presenting on 11/11/2020 for comprehensive medical examination. Current medical complaints include: worsened depression  Hypothyroidism - Medications: Synthroid 11/13/2020 - ran out of pill, has been taking for about a month - Current symptoms:  weight changes and worsened depression - Symptoms have gradually worsened  Depression - Medications: seroquel, effexor - getting a lot worse over the past month. Went without medicine for about a week, been back on for almost a month but symptoms still not controlled. - Taking: good compliance - Counseling: no - Previous hospitalizations: no - FH of psych illness: mom, sister with depression - Symptoms: difficulty sleeping, anhedonia - Current stressors: none identified - Coping Mechanisms: none identified - no current SI, did prevoiusly have passive SI without plan when was without med - was doing well prior to running out of medication.  OBESITY - Meds: none - Previously on med for weight loss years ago but unsure of name. - Complications of obesity: prediabetes, HLD - Peak weight: 169lb - Weight loss to date: 0 lb - Diet: fruit, vegetables, salads. Lean meats. 2 meals per day, lunch and dinner. Only drinks sugar free drinks, water, coffee.  - Exercise: walking 30 min per day  She currently lives with: daughter, nephew.  Depression Screen done today and results listed below:  Depression screen [facility] 2/9 11/11/2020 04/28/2020 10/18/2019 03/23/2019 02/19/2019  Decreased Interest 3 3 0 2 3  Down, Depressed, Hopeless 3 1 0 1 3  PHQ - 2 Score 6 4 0 3 6  Altered sleeping 3 3 1 3 3   Tired, decreased energy 3 3 1 3 2   Change in appetite 3 3 0 0 2  Feeling bad or  failure about yourself  2 1 0 2 3  Trouble concentrating 2 0 0 0 1  Moving slowly or fidgety/restless 1 1 0 0 0  Suicidal thoughts 2 0 0 0 1  PHQ-9 Score 22 15 2 11 18   Difficult doing work/chores Somewhat difficult - Not difficult at all Not difficult at all Somewhat difficult  Some recent data might be hidden    The patient does not have a history of falls. I did not complete a risk assessment for falls. A plan of care for falls was not documented.   Past Medical History:  Past Medical History:  Diagnosis Date   Aggressive behavior 06/29/2016   Benign cyst of skin 10/18/2014   Family history of chronic renal disease 06/16/2015   GERD (gastroesophageal reflux disease)    Hemorrhoid    Hx of iron deficiency anemia 06/16/2015   Hyperlipidemia    Pneumonia    Prolonged grief reaction 09/23/2016    Surgical History:  Past Surgical History:  Procedure Laterality Date   COLONOSCOPY     PARTIAL HYSTERECTOMY  2010    Medications:  Current Outpatient Medications on File Prior to Visit  Medication Sig   docusate sodium (COLACE) 50 MG capsule Take 50 mg by mouth daily as needed for mild constipation.   fluticasone (FLONASE) 50 MCG/ACT nasal spray Place into both nostrils.   QUEtiapine (SEROQUEL)  50 MG tablet TAKE 1 TABLET BY MOUTH EVERYDAY AT BEDTIME   No current facility-administered medications on file prior to visit.    Allergies:  Allergies  Allergen Reactions   Aspirin Nausea And Vomiting   Codeine Hives    Social History:  Social History   Socioeconomic History   Marital status: Married    Spouse name: [name]   Number of children: Not on file   Years of education: Not on file   Highest education level: Not on file  Occupational History   Not on file  Tobacco Use   Smoking status: Every Day    Years: 16.00    Types: Cigarettes    Start date: 03/24/2014   Smokeless tobacco: Never  Vaping Use   Vaping Use: Never used  Substance and Sexual Activity   Alcohol use:  No    Alcohol/week: 0.0 standard drinks   Drug use: No   Sexual activity: Not Currently  Other Topics Concern   Not on file  Social History Narrative   Not on file   Social Determinants of Health   Financial Resource Strain: High Risk   Difficulty of Paying Living Expenses: Hard  Food Insecurity: Food Insecurity Present   Worried About Running Out of Food in the Last Year: Sometimes true   Ran Out of Food in the Last Year: Sometimes true  Transportation Needs: No Transportation Needs   Lack of Transportation (Medical): No   Lack of Transportation (Non-Medical): No  Physical Activity: Insufficiently Active   Days of Exercise per Week: 3 days   Minutes of Exercise per Session: 30 min  Stress: Stress Concern Present   Feeling of Stress : Very much  Social Connections: Moderately Integrated   Frequency of Communication with Friends and Family: More than three times a week   Frequency of Social Gatherings with Friends and Family: More than three times a week   Attends Religious Services: 1 to 4 times per year   Active Member of [employer] or Organizations: No   Attends Engineer, structural: Never   Marital Status: Married  Catering manager Violence: Not At Risk   Fear of Current or Ex-Partner: No   Emotionally Abused: No   Physically Abused: No   Sexually Abused: No   Social History   Tobacco Use  Smoking Status Every Day   Years: 16.00   Types: Cigarettes   Start date: 03/24/2014  Smokeless Tobacco Never   Social History   Substance and Sexual Activity  Alcohol Use No   Alcohol/week: 0.0 standard drinks    Family History:  Family History  Problem Relation Age of Onset   Autoimmune disease Father    Kidney failure Father    Heart disease Father    Heart attack Father    Hypertension Father    Hyperlipidemia Father    Clotting disorder Mother    Kidney disease Mother    Heart attack Mother    Thyroid disease Sister    Cancer Sister        breast    Breast cancer Sister 62   Thyroid disease Daughter    Diabetes Paternal Grandmother    Diabetes type II Paternal Grandmother    Hypertension Daughter    Hearing loss Maternal Grandmother    Cancer Maternal Grandmother        colon   Cancer Maternal Grandfather        prostate   Pneumonia Paternal Grandfather    Cancer Paternal Grandfather  bone    Past medical history, surgical history, medications, allergies, family history and social history reviewed with patient today and changes made to appropriate areas of the chart.      Objective:    BP 132/82   Pulse 91   Temp 98.2 F (36.8 C)   Ht 5\' 5"  (1.651 m)   Wt 169 lb 6.4 oz (76.8 kg)   SpO2 96%   BMI 28.19 kg/m   Wt Readings from Last 3 Encounters:  11/11/20 169 lb 6.4 oz (76.8 kg)  04/28/20 165 lb 12.8 oz (75.2 kg)  10/18/19 153 lb 11.2 oz (69.7 kg)    Physical Exam Constitutional:      Appearance: Normal appearance.  HENT:     Head: Normocephalic.     Right Ear: External ear normal.     Left Ear: External ear normal.     Nose: Nose normal.     Mouth/Throat:     Mouth: Mucous membranes are moist.     Pharynx: Oropharynx is clear.  Eyes:     Extraocular Movements: Extraocular movements intact.     Pupils: Pupils are equal, round, and reactive to light.  Cardiovascular:     Rate and Rhythm: Normal rate and regular rhythm.     Heart sounds: Normal heart sounds. No murmur heard. Pulmonary:     Effort: Pulmonary effort is normal.     Breath sounds: Normal breath sounds.  Abdominal:     General: Bowel sounds are normal.     Palpations: Abdomen is soft.     Tenderness: There is no abdominal tenderness. There is no guarding.  Genitourinary:    General: Normal vulva.     Vagina: No vaginal discharge.  Musculoskeletal:        General: Normal range of motion.     Cervical back: Normal range of motion.     Right lower leg: No edema.     Left lower leg: No edema.  Lymphadenopathy:     Cervical: No cervical  adenopathy.  Skin:    General: Skin is warm and dry.  Neurological:     Mental Status: She is alert and oriented to person, place, and time. Mental status is at baseline.     Gait: Gait normal.  Psychiatric:        Behavior: Behavior normal.        Thought Content: Thought content normal.        Judgment: Judgment normal.    Results for orders placed or performed in visit on 04/28/20  Hepatitis C Antibody  Result Value Ref Range   Hepatitis C Ab NON-REACTIVE NON-REACTI   SIGNAL TO CUT-OFF 0.01 <1.00  HIV antibody (with reflex)  Result Value Ref Range   HIV 1&2 Ab, 4th Generation NON-REACTIVE NON-REACTI  TSH  Result Value Ref Range   TSH 32.56 (H) mIU/L  Lipid panel  Result Value Ref Range   Cholesterol 178 <200 mg/dL   HDL 55 > OR = 50 mg/dL   Triglycerides 04/30/20 412 mg/dL   LDL Cholesterol (Calc) 100 (H) mg/dL (calc)   Total CHOL/HDL Ratio 3.2 <5.0 (calc)   Non-HDL Cholesterol (Calc) 123 <130 mg/dL (calc)  COMPLETE METABOLIC PANEL WITH GFR  Result Value Ref Range   Glucose, Bld 93 65 - 99 mg/dL   BUN 8 7 - 25 mg/dL   Creat <878 6.76 - 7.20 mg/dL   GFR, Est Non African American 82 > OR = 60 mL/min/1.65m2   GFR, Est African  American 95 > OR = 60 mL/min/1.6873m2   BUN/Creatinine Ratio NOT APPLICABLE 6 - 22 (calc)   Sodium 141 135 - 146 mmol/L   Potassium 4.3 3.5 - 5.3 mmol/L   Chloride 106 98 - 110 mmol/L   CO2 28 20 - 32 mmol/L   Calcium 9.4 8.6 - 10.4 mg/dL   Total Protein 6.4 6.1 - 8.1 g/dL   Albumin 4.2 3.6 - 5.1 g/dL   Globulin 2.2 1.9 - 3.7 g/dL (calc)   AG Ratio 1.9 1.0 - 2.5 (calc)   Total Bilirubin 0.3 0.2 - 1.2 mg/dL   Alkaline phosphatase (APISO) 79 37 - 153 U/L   AST 14 10 - 35 U/L   ALT 14 6 - 29 U/L  CBC with Differential/Platelet  Result Value Ref Range   WBC 8.5 3.8 - 10.8 Thousand/uL   RBC 4.23 3.80 - 5.10 Million/uL   Hemoglobin 13.6 11.7 - 15.5 g/dL   HCT 09.840.2 11.935.0 - 14.745.0 %   MCV 95.0 80.0 - 100.0 fL   MCH 32.2 27.0 - 33.0 pg   MCHC 33.8 32.0  - 36.0 g/dL   RDW 82.912.5 56.211.0 - 13.015.0 %   Platelets 225 140 - 400 Thousand/uL   MPV 10.0 7.5 - 12.5 fL   Neutro Abs 6,222 1,500 - 7,800 cells/uL   Lymphs Abs 1,505 850 - 3,900 cells/uL   Absolute Monocytes 612 200 - 950 cells/uL   Eosinophils Absolute 111 15 - 500 cells/uL   Basophils Absolute 51 0 - 200 cells/uL   Neutrophils Relative % 73.2 %   Total Lymphocyte 17.7 %   Monocytes Relative 7.2 %   Eosinophils Relative 1.3 %   Basophils Relative 0.6 %  Hemoglobin A1c  Result Value Ref Range   Hgb A1c MFr Bld 5.8 (H) <5.7 % of total Hgb   Mean Plasma Glucose 120 mg/dL   eAG (mmol/L) 6.6 mmol/L  TSH  Result Value Ref Range   TSH 2.19 mIU/L      Assessment & Plan:   Problem List Items Addressed This Visit       Endocrine   Hypothyroidism (Chronic)   Relevant Medications   levothyroxine (SYNTHROID) 112 MCG tablet   Other Relevant Orders   TSH     Other   Hypercholesterolemia   Relevant Medications   rosuvastatin (CRESTOR) 20 MG tablet   Vitamin B12 deficiency - Primary   Relevant Orders   B12   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[facility](HCC)   Relevant Medications   venlafaxine XR (EFFEXOR-XR) 75 MG 24 hr capsule   Major depressive disorder, recurrent (HCC)   Relevant Medications   venlafaxine XR (EFFEXOR-XR) 75 MG 24 hr capsule   Other Relevant Orders   [facility]   Other Visit Diagnoses     Prediabetes       Relevant Orders   Hemoglobin A1c   Overweight       Relevant Orders   Amb Ref to Medical Weight Management   Anxiety disorder, unspecified type       Relevant Medications   venlafaxine XR (EFFEXOR-XR) 75 MG 24 hr capsule   Screening for cervical cancer       Relevant Orders   Cytology - PAP   Screening for colon cancer       Relevant Orders   Cologuard        Follow up plan: Return in about 4 weeks (around 12/09/2020) for depression.   LABORATORY TESTING:  -  Pap smear: 2010, negative per patient.  S/p  partial hysterectomy, cervix still in place. Due.  IMMUNIZATIONS:   - Tdap: Tetanus vaccination status reviewed: last tetanus booster within 10 years. - Influenza: Administered today - Pneumococcal: Administered today - HPV: Not applicable - Shingrix vaccine: due - COVID vaccine: 3 doses of mRNA vaccine  SCREENING: - Mammogram:  previously ordered   - Colonoscopy:  Cologuard ordered today   - Bone Density:  previously ordered   - Lung Cancer Screening: Not applicable 0.5ppd, ~23 years.   Hep C Screening: UTD STD testing and prevention (HIV/chl/gon/syphilis): UTD Sexual History: sometimes, no concern for STIs. Does not wear condoms, counseling provided.  Menstrual History/LMP/Abnormal Bleeding: s/p partial hysterectomy Incontinence Symptoms: none  Osteoporosis: Discussed high calcium and vitamin D supplementation, weight bearing exercises.  Advanced Care Planning: A voluntary discussion about advance care planning including the explanation and discussion of advance directives.  Discussed health care proxy and Living will, and the patient was able to identify a health care proxy as daughter, Laurell Roof.  Patient does not have a living will at present time. If patient does have living will, I have requested they bring this to the clinic to be scanned in to their chart.  PATIENT COUNSELING:   Advised to take 1 mg of folate supplement per day if capable of pregnancy.   Sexuality: Discussed sexually transmitted diseases, partner selection, use of condoms, avoidance of unintended pregnancy  and contraceptive alternatives.   Advised to avoid cigarette smoking.  I discussed with the patient that most people either abstain from alcohol or drink within safe limits (<=14/week and <=4 drinks/occasion for males, <=7/weeks and <= 3 drinks/occasion for females) and that the risk for alcohol disorders and other health effects rises proportionally with the number of drinks per week and how often  a drinker exceeds daily limits.  Discussed cessation/primary prevention of drug use and availability of treatment for abuse.   Diet: Encouraged to adjust caloric intake to maintain  or achieve ideal body weight, to reduce intake of dietary saturated fat and total fat, to limit sodium intake by avoiding high sodium foods and not adding table salt, and to maintain adequate dietary potassium and calcium preferably from fresh fruits, vegetables, and low-fat dairy products.    stressed the importance of regular exercise  Injury prevention: Discussed safety belts, safety helmets, smoke detector, smoking near bedding or upholstery.   Dental health: Discussed importance of regular tooth brushing, flossing, and dental visits.    NEXT PREVENTATIVE PHYSICAL DUE IN 1 YEAR. Return in about 4 weeks (around 12/09/2020) for depression.

## 2020-11-11 NOTE — Assessment & Plan Note (Signed)
Recommend cessation. ?

## 2020-11-11 NOTE — Assessment & Plan Note (Signed)
Worsened since running out of meds. Will recheck TSH. If wnl, plan to increase seroquel. Referred for counseling today.

## 2020-11-11 NOTE — Assessment & Plan Note (Signed)
Worsened since running out of meds. Refills provided. Will check TSH, if wnl, plan to increase seroquel. Referred for counseling. F/u in 4 weeks.

## 2020-11-12 ENCOUNTER — Telehealth: Payer: Self-pay | Admitting: *Deleted

## 2020-11-12 LAB — HEMOGLOBIN A1C
Hgb A1c MFr Bld: 5.6 % of total Hgb (ref <5.7)
Mean Plasma Glucose: 114 mg/dL
eAG (mmol/L): 6.3 mmol/L

## 2020-11-12 LAB — CYTOLOGY - PAP
Chlamydia: NEGATIVE
Comment: NEGATIVE
Comment: NEGATIVE
Comment: NEGATIVE
Comment: NORMAL
Diagnosis: NEGATIVE
High risk HPV: NEGATIVE
Neisseria Gonorrhea: NEGATIVE
Trichomonas: NEGATIVE

## 2020-11-12 LAB — VITAMIN B12: Vitamin B-12: 227 pg/mL (ref 200–1100)

## 2020-11-12 LAB — TSH: TSH: 1.89 mIU/L

## 2020-11-12 NOTE — Chronic Care Management (AMB) (Signed)
  Care Management   Outreach Note  11/12/2020 Name: Charlotte Thompson MRN: [id number] DOB: February 05, 1966  Referred by: Danelle Berry, PA-C Reason for referral : Care Coordination (Outreach to schedule referral with Licensed Clinical SW)   An unsuccessful telephone outreach was attempted today. The patient was referred to the case management team for assistance with care management and care coordination.   Follow Up Plan:  A HIPAA compliant phone message was left for the patient providing contact information and requesting a return call.  If patient returns call to provider office, please advise to call Embedded Care Management Care Guide Faisal Stradling at [phone]  Burman Nieves, CCMA Care Guide, Embedded Care Coordination [facility]  Care Management  Direct Dial: [phone]

## 2020-11-18 NOTE — Chronic Care Management (AMB) (Signed)
  Care Management   Outreach Note  11/18/2020 Name: SARAE NICHOLES MRN: [id number] DOB: 1965/11/02  Referred by: Danelle Berry, PA-C Reason for referral : Care Coordination (Outreach to schedule referral with Licensed Clinical SW)   A second unsuccessful telephone outreach was attempted today. The patient was referred to the case management team for assistance with care management and care coordination.   Follow Up Plan:  If patient returns call to provider office, please advise to call Embedded Care Management Care Guide Tierney Behl at [phone]  Burman Nieves, CCMA Care Guide, Embedded Care Coordination [facility]  Care Management  Direct Dial: [phone]

## 2020-11-21 NOTE — Chronic Care Management (AMB) (Signed)
  Care Management   Note  11/21/2020 Name: Charlotte Thompson MRN: [id number] DOB: 1965-11-12  Charlotte Thompson is a 55 y.o. year old female who is a primary care patient of Danelle Berry, [place]. I reached out to Roanna Epley by phone today in response to a referral sent by [name]'s health plan.    [name] was given information about care management services today including:  Care management services include personalized support from designated clinical staff supervised by her physician, including individualized plan of care and coordination with other care providers 24/7 contact phone numbers for assistance for urgent and routine care needs. The patient may stop care management services at any time by phone call to the office staff.  Patient agreed to services and verbal consent obtained.   Follow up plan: Telephone appointment with care management team member scheduled for: 12/01/2020  Burman Nieves, CCMA Care Guide, Embedded Care Coordination [facility]  Care Management  Direct Dial: [phone]

## 2020-11-24 ENCOUNTER — Other Ambulatory Visit: Payer: Self-pay | Admitting: Family Medicine

## 2020-11-24 DIAGNOSIS — F419 Anxiety disorder, unspecified: Secondary | ICD-10-CM

## 2020-11-24 DIAGNOSIS — F331 Major depressive disorder, recurrent, moderate: Secondary | ICD-10-CM

## 2020-11-24 DIAGNOSIS — F605 Obsessive-compulsive personality disorder: Secondary | ICD-10-CM

## 2020-12-01 ENCOUNTER — Ambulatory Visit: Payer: No Typology Code available for payment source | Admitting: *Deleted

## 2020-12-01 DIAGNOSIS — F419 Anxiety disorder, unspecified: Secondary | ICD-10-CM

## 2020-12-01 DIAGNOSIS — F325 Major depressive disorder, single episode, in full remission: Secondary | ICD-10-CM

## 2020-12-01 NOTE — Patient Instructions (Signed)
Visit Information  PATIENT GOALS:   Goals Addressed             This Visit's Progress    Begin and Stick with Counseling-Depression       Timeframe:  Long-Range Goal Priority:  High Start Date:       12/01/20                      Expected End Date:   05/31/20                    Follow Up Date 12/15/20    - check out counseling - keep 90 percent of counseling appointments - schedule counseling appointment    Why is this important?   Beating depression may take some time.  If you don't feel better right away, don't give up on your treatment plan.    Notes:         Ms. Laffey was given information about Care Management services by the embedded care coordination team including:  Care Management services include personalized support from designated clinical staff supervised by her physician, including individualized plan of care and coordination with other care providers 24/7 contact phone numbers for assistance for urgent and routine care needs. The patient may stop CCM services at any time (effective at the end of the month) by phone call to the office staff.  Patient agreed to services and verbal consent obtained.   The patient verbalized understanding of instructions, educational materials, and care plan provided today and declined offer to receive copy of patient instructions, educational materials, and care plan.   Telephone follow up appointment with care management team member scheduled for:12/15/20  Verna Czech, LCSW Clinical Social Worker  [facility] Care Management [phone]

## 2020-12-01 NOTE — Chronic Care Management (AMB) (Cosign Needed)
Care Management Clinical Social Work Note  12/01/2020 Name: Charlotte Thompson MRN: [id number] DOB: Oct 19, 1965  Charlotte Thompson is a 55 y.o. year old female who is a primary care patient of Danelle Berry, [place].  The Care Management team was consulted for assistance with chronic disease management and coordination needs.  Engaged with patient by telephone for initial visit in response to provider referral for social work chronic care management and care coordination services  Consent to Services:  [name] was given information about Care Management services today including:  Care Management services includes personalized support from designated clinical staff supervised by her physician, including individualized plan of care and coordination with other care providers 24/7 contact phone numbers for assistance for urgent and routine care needs. The patient may stop case management services at any time by phone call to the office staff.  Patient agreed to services and consent obtained.   Assessment: Review of patient past medical history, allergies, medications, and health status, including review of relevant consultants reports was performed today as part of a comprehensive evaluation and provision of chronic care management and care coordination services.  SDOH (Social Determinants of Health) assessments and interventions performed:  SDOH Interventions    Flowsheet Row Most Recent Value  SDOH Interventions   SDOH Interventions for the Following Domains Depression, Stress  Stress Interventions Provide Counseling  Depression Interventions/Treatment  Medication, Counseling        Advanced Directives Status: Not addressed in this encounter.  Care Plan  Allergies  Allergen Reactions   Aspirin Nausea And Vomiting   Codeine Hives    Outpatient Encounter Medications as of 12/01/2020  Medication Sig   docusate sodium (COLACE) 50 MG capsule Take 50 mg by mouth daily as needed for mild  constipation.   fluticasone (FLONASE) 50 MCG/ACT nasal spray Place into both nostrils.   levothyroxine (SYNTHROID) 112 MCG tablet Take 1 tablet (112 mcg total) by mouth daily.   QUEtiapine (SEROQUEL) 50 MG tablet TAKE 1 TABLET BY MOUTH EVERY NIGHT AT BEDTIME   rosuvastatin (CRESTOR) 20 MG tablet Take 1 tablet (20 mg total) by mouth daily.   venlafaxine XR (EFFEXOR-XR) 75 MG 24 hr capsule Take 3 capsules (225 mg total) by mouth daily with breakfast.   No facility-administered encounter medications on file as of 12/01/2020.    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) 10/07/2016   Vitamin D deficiency 06/29/2016   Vitamin B12 deficiency 06/29/2016   Microalbuminuria 12/27/2015   Insomnia 12/27/2015   Anxiety and depression 12/05/2015   Hypothyroidism 06/16/2015   Tobacco abuse 06/16/2015   Hypercholesterolemia 06/16/2015   Heart murmur 06/16/2015   Hot flashes, menopausal 09/02/2014    Conditions to be addressed/monitored: Depression; Mental Health Concerns   Care Plan : Depression (Adult)  Updates made by Wenda Overland, LCSW since 12/01/2020 12:00 AM     Problem: Symptoms (Depression)      Long-Range Goal: Symptoms Monitored and Managed   Start Date: 12/01/2020  Expected End Date: 07/01/2021  Priority: High  Note:   Current Barriers:  Mental Health Concerns Depression Suicidal Ideation/Homicidal Ideation: No  Clinical Social Work Goal(s):  Over the next 90 days, patient will work with SW bi-weekly by telephone or in person to reduce or manage symptoms related to depressed mood patient will follow up with mental health therapist coordinated by [employer] for ongoing mental health counseling  as directed by SW  Interventions: Patient interviewed and appropriate assessments performed: PHQ 2 PHQ 9 SDOH Interventions    Flowsheet Row Most Recent  Value  SDOH Interventions   SDOH Interventions for the Following Domains Depression, Stress  Stress Interventions Provide Counseling  Depression Interventions/Treatment  Medication, Counseling     Patient discussed depressed mood-difficulty motivating self to complete her ADL's, poor sleep-mind races, cannot relax "My mind and body are fighting" Patient confirmed having a strong family and spiritual support network and discussed goal of "wanting to have a life". Patient motivated for treatment. Explored treatment options including the consideration of long term mental health counseling to develop positive coping strategies Provided patient with information about referral to [facility] that will link patient to a in-network mental health provider Collaborated with [facility]-referral completed Patient provided with supportive counseling Active listening / Reflection utilized  Patient provided number to crisis line-988 to utilize in the event of a crisis  Patient Self Care Activities:  Attends church or other social activities Performs ADL's independently Performs IADL's independently Ability for insight  Patient Coping Strengths:  Supportive Relationships [employer] Able to Communicate Effectively  Patient Self Care Deficits:  Limited knowledge of in-network mental health providers  Initial goal documentation       Task: Alleviate Barriers to Depression Treatment        Follow Up Plan: SW will follow up with patient by phone over the next 14 business days  [employer], LCSW Clinical Social Worker  [facility] Care Management [phone]

## 2020-12-15 ENCOUNTER — Ambulatory Visit: Payer: No Typology Code available for payment source | Admitting: *Deleted

## 2020-12-15 DIAGNOSIS — F419 Anxiety disorder, unspecified: Secondary | ICD-10-CM

## 2020-12-15 DIAGNOSIS — F325 Major depressive disorder, single episode, in full remission: Secondary | ICD-10-CM

## 2020-12-15 NOTE — Chronic Care Management (AMB) (Signed)
Care Management Clinical Social Work Note  12/15/2020 Name: [name] MRN: [id number] DOB: March 30, 1965  Charlotte Thompson is a 55 y.o. year old female who is a primary care patient of Charlotte Thompson, [place].  The Care Management team was consulted for assistance with chronic disease management and coordination needs.  Engaged with patient by telephone for follow up visit in response to provider referral for social work chronic care management and care coordination services  Consent to Services:  Charlotte Thompson was given information about Care Management services today including:  Care Management services includes personalized support from designated clinical staff supervised by her physician, including individualized plan of care and coordination with other care providers 24/7 contact phone numbers for assistance for urgent and routine care needs. The patient may stop case management services at any time by phone call to the office staff.  Patient agreed to services and consent obtained.   Assessment: Review of patient past medical history, allergies, medications, and health status, including review of relevant consultants reports was performed today as part of a comprehensive evaluation and provision of chronic care management and care coordination services.  SDOH (Social Determinants of Health) assessments and interventions performed:    Advanced Directives Status: Not addressed in this encounter.  Care Plan  Allergies  Allergen Reactions   Aspirin Nausea And Vomiting   Codeine Hives    Outpatient Encounter Medications as of 12/15/2020  Medication Sig   docusate sodium (COLACE) 50 MG capsule Take 50 mg by mouth daily as needed for mild constipation.   fluticasone (FLONASE) 50 MCG/ACT nasal spray Place into both nostrils.   levothyroxine (SYNTHROID) 112 MCG tablet Take 1 tablet (112 mcg total) by mouth daily.   QUEtiapine (SEROQUEL) 50 MG tablet TAKE 1 TABLET BY MOUTH EVERY NIGHT AT  BEDTIME   rosuvastatin (CRESTOR) 20 MG tablet Take 1 tablet (20 mg total) by mouth daily.   venlafaxine XR (EFFEXOR-XR) 75 MG 24 hr capsule Take 3 capsules (225 mg total) by mouth daily with breakfast.   No facility-administered encounter medications on file as of 12/15/2020.    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) 10/07/2016   Vitamin D deficiency 06/29/2016   Vitamin B12 deficiency 06/29/2016   Microalbuminuria 12/27/2015   Insomnia 12/27/2015   Anxiety and depression 12/05/2015   Hypothyroidism 06/16/2015   Tobacco abuse 06/16/2015   Hypercholesterolemia 06/16/2015   Heart murmur 06/16/2015   Hot flashes, menopausal 09/02/2014    Conditions to be addressed/monitored: Depression; Mental Health Concerns   Care Plan : Depression (Adult)  Updates made by Charlotte Overland, LCSW since 12/15/2020 12:00 AM     Problem: Symptoms (Depression)      Long-Range Goal: Symptoms Monitored and Managed   Start Date: 12/01/2020  Expected End Date: 07/01/2021  Priority: High  Note:   Current Barriers:  Mental Health Concerns Depression Suicidal Ideation/Homicidal Ideation: No  Clinical Social Work Goal(s):  Over the next 90 days, patient will work with SW bi-weekly by telephone or in person to reduce or manage symptoms related to depressed mood patient will follow up with mental health therapist coordinated by [employer] for ongoing mental health counseling as directed by SW  Interventions: SDOH Interventions    Flowsheet Row Most Recent Value  SDOH Interventions   SDOH Interventions for the Following Domains Depression, Stress  Stress Interventions Provide Counseling  Depression Interventions/Treatment  Medication, Counseling     Patient continues to report symptoms of depression and difficulties coping with stressful job Patient discussed no contact  with [facility] regarding outpatient mental health follow up Current coping explored-Patient confirmed that she has to "push herself through it" to get through daily tasks at work-patient confirmed that getting out to walk the dog helps, she also has a therapy blanket that she uses as well, patient also confirms actively looking for another job-continued use of these strategies reinforced Conference call to [facility] who stated that patient would need to be referred out-they are not contracted with her insurance Phone call to patient's insurance-confirmed that patient does not have a outpatient mental health benefit Mental health agencies contacted to inquire about possible follow up using a sliding scale including [facility], [facility], [facility], [facility] and [facility] with [facility] being the only availability for outpatient services using a sliding scale fee-walk-in's available Monday-Friday 8am-6pm (closed for lunch from 12pm-1pm) Contacted patient back to discuss insurance coverage and possible follow up with [facility]-no answer or voicemail to leave a message This Child psychotherapist will plan to contact patient by the end of the week to follow up on available resources for ongoing mental health support   Patient Self Care Activities:  Attends church or other social activities Performs ADL's independently Performs IADL's independently Ability for insight  Patient Coping Strengths:  Supportive Relationships [employer] Able to Communicate Effectively  Patient Self Care Deficits:  Limited knowledge of in-network mental health providers  Please see past updates related to this goal by clicking on the "Past Updates" button in the selected goal          Follow Up Plan: SW will follow up with patient by phone over the next 6-7 business days  Charlotte Czech, LCSW Clinical Social Worker  [facility] Care  Management [phone]

## 2020-12-15 NOTE — Patient Instructions (Signed)
Visit Information   Goals Addressed             This Visit's Progress    Begin and Stick with Counseling-Depression       Timeframe:  Long-Range Goal Priority:  High Start Date:       12/01/20                      Expected End Date:   05/31/20                    Follow Up Date 12/19/20    - check out counseling - keep 90 percent of counseling appointments - schedule counseling appointment  -confirmed that patient does not have a mental health benefit on her insurance plan-please contact [facility] for follow up-walk ins Monday-Friday 8am-6pm (closed for lunch 12-1 pm) [address] [place], [place] [address]  [phone]  Why is this important?   Beating depression may take some time.  If you don't feel better right away, don't give up on your treatment plan.    Notes:         The patient verbalized understanding of instructions, educational materials, and care plan provided today and declined offer to receive copy of patient instructions, educational materials, and care plan.   Telephone follow up appointment with care management team member scheduled for:12/19/20  Verna Czech, LCSW Clinical Social Worker  [facility] Care Management [phone]

## 2020-12-19 ENCOUNTER — Telehealth: Payer: Self-pay | Admitting: *Deleted

## 2020-12-19 ENCOUNTER — Telehealth: Payer: No Typology Code available for payment source | Admitting: *Deleted

## 2020-12-19 NOTE — Telephone Encounter (Signed)
  Care Management   Follow Up Note   12/19/2020 Name: LACE CHENEVERT MRN: [id number] DOB: [birth year]   Referred by: Danelle Berry, PA-C Reason for referral : Care Coordination   An unsuccessful telephone outreach was attempted today. The patient was referred to the case management team for assistance with care management and care coordination.   Follow Up Plan: Telephone follow up appointment with care management team member scheduled for: 12/22/20  Verna Czech, LCSW Clinical Social Worker  [facility] Care Management [phone]

## 2020-12-22 ENCOUNTER — Ambulatory Visit: Payer: No Typology Code available for payment source | Admitting: *Deleted

## 2020-12-22 DIAGNOSIS — F419 Anxiety disorder, unspecified: Secondary | ICD-10-CM

## 2020-12-22 DIAGNOSIS — F325 Major depressive disorder, single episode, in full remission: Secondary | ICD-10-CM

## 2020-12-22 NOTE — Patient Instructions (Signed)
Visit Information   Goals Addressed             This Visit's Progress    Begin and Stick with Counseling-Depression       Timeframe:  Long-Range Goal Priority:  High Start Date:       12/01/20                      Expected End Date:   05/31/20                    Follow Up Date 01/02/21    - check out counseling - keep 90 percent of counseling appointments - schedule counseling appointment  -confirmed that patient does not have a mental health benefit on her insurance plan-please contact [facility] for follow up-walk ins Monday-Friday 8am-6pm (closed for lunch 12-1 pm) [address] [place], [place] [address]  [phone]  Why is this important?   Beating depression may take some time.  If you don't feel better right away, don't give up on your treatment plan.    Notes:         The patient verbalized understanding of instructions, educational materials, and care plan provided today and declined offer to receive copy of patient instructions, educational materials, and care plan.   Telephone follow up appointment with care management team member scheduled for: 01/05/21  Verna Czech, LCSW Clinical Social Worker  [facility] Care Management [phone]

## 2020-12-22 NOTE — Chronic Care Management (AMB) (Signed)
Care Management Clinical Social Work Note  12/22/2020 Name: Charlotte Thompson MRN: [id number] DOB: Mar 20, 1965  Charlotte Thompson is a 55 y.o. year old female who is a primary care patient of Danelle Berry, [place].  The Care Management team was consulted for assistance with chronic disease management and coordination needs.  Engaged with patient by telephone for follow up visit in response to provider referral for social work chronic care management and care coordination services  Consent to Services:   Patient agreed to services and consent obtained.   Assessment: Review of patient past medical history, allergies, medications, and health status, including review of relevant consultants reports was performed today as part of a comprehensive evaluation and provision of chronic care management and care coordination services.  SDOH (Social Determinants of Health) assessments and interventions performed:    Advanced Directives Status: Not addressed in this encounter.  Care Plan  Allergies  Allergen Reactions   Aspirin Nausea And Vomiting   Codeine Hives    Outpatient Encounter Medications as of 12/22/2020  Medication Sig   docusate sodium (COLACE) 50 MG capsule Take 50 mg by mouth daily as needed for mild constipation.   fluticasone (FLONASE) 50 MCG/ACT nasal spray Place into both nostrils.   levothyroxine (SYNTHROID) 112 MCG tablet Take 1 tablet (112 mcg total) by mouth daily.   QUEtiapine (SEROQUEL) 50 MG tablet TAKE 1 TABLET BY MOUTH EVERY NIGHT AT BEDTIME   rosuvastatin (CRESTOR) 20 MG tablet Take 1 tablet (20 mg total) by mouth daily.   venlafaxine XR (EFFEXOR-XR) 75 MG 24 hr capsule Take 3 capsules (225 mg total) by mouth daily with breakfast.   No facility-administered encounter medications on file as of 12/22/2020.    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[facility])  10/07/2016   Vitamin D deficiency 06/29/2016   Vitamin B12 deficiency 06/29/2016   Microalbuminuria 12/27/2015   Insomnia 12/27/2015   Anxiety and depression 12/05/2015   Hypothyroidism 06/16/2015   Tobacco abuse 06/16/2015   Hypercholesterolemia 06/16/2015   Heart murmur 06/16/2015   Hot flashes, menopausal 09/02/2014    Conditions to be addressed/monitored: Depression; Mental Health Concerns   Care Plan : Depression (Adult)  Updates made by Wenda Overland, LCSW since 12/22/2020 12:00 AM     Problem: Symptoms (Depression)      Long-Range Goal: Symptoms Monitored and Managed   Start Date: 12/01/2020  Expected End Date: 07/01/2021  This Visit's Progress: On track  Priority: High  Note:   Current Barriers:  Mental Health Concerns Depression Suicidal Ideation/Homicidal Ideation: No  Clinical Social Work Goal(s):  Over the next 90 days, patient will work with SW bi-weekly by telephone or in person to reduce or manage symptoms related to depressed mood patient will follow up with mental health therapist coordinated by [employer] for ongoing mental health counseling as directed by SW  Interventions: SDOH Interventions    Flowsheet Row Most Recent Value  SDOH Interventions   SDOH Interventions for the Following Domains Depression, Stress  Stress Interventions Provide Counseling  Depression Interventions/Treatment  Medication, Counseling     Patient continues to report symptoms of depression and difficulties coping with stressful job Confirmed with patient that she does not have a outpatient mental health benefit Discussed the mental health agencies contacted to inquire about possible follow up using a sliding scale including [facility], [facility], [facility], [facility] and  [facility] with [facility] being the only availability for outpatient services using a sliding scale fee-walk-in's available Monday-Friday 8am-6pm (closed for lunch  from 12pm-1pm) Patient agreeable to considering follow up with [facility] This Child psychotherapist encouraged follow up with [facility] for ongoing mental health treatment, reinforced need to complete intake process to begin ongoing treatment for chronic mental health needs related to symptoms of depression and stress Patient Self Care Activities:  Attends church or other social activities Performs ADL's independently Performs IADL's independently Ability for insight  Patient Coping Strengths:  Supportive Relationships [employer] Able to Communicate Effectively  Patient Self Care Deficits:  Limited knowledge of in-network mental health providers  Please see past updates related to this goal by clicking on the "Past Updates" button in the selected goal          Follow Up Plan: SW will follow up with patient by phone over the next 14 business days  [employer], LCSW Clinical Social Worker  [facility] Care Management [phone]

## 2021-01-02 LAB — COLOGUARD: Cologuard: NEGATIVE

## 2021-04-19 ENCOUNTER — Encounter: Payer: Self-pay | Admitting: Family Medicine

## 2021-04-27 ENCOUNTER — Encounter: Payer: Self-pay | Admitting: Internal Medicine

## 2021-04-27 ENCOUNTER — Ambulatory Visit: Payer: Managed Care, Other (non HMO) | Admitting: Internal Medicine

## 2021-04-27 VITALS — BP 126/72 | HR 92 | Temp 98.0°F | Resp 16 | Ht 65.0 in | Wt 168.5 lb

## 2021-04-27 DIAGNOSIS — E038 Other specified hypothyroidism: Secondary | ICD-10-CM | POA: Diagnosis not present

## 2021-04-27 DIAGNOSIS — F332 Major depressive disorder, recurrent severe without psychotic features: Secondary | ICD-10-CM

## 2021-04-27 DIAGNOSIS — E7800 Pure hypercholesterolemia, unspecified: Secondary | ICD-10-CM

## 2021-04-27 MED ORDER — QUETIAPINE FUMARATE 100 MG PO TABS: 100.0000 mg | ORAL_TABLET | Freq: Every day | ORAL | 1 refills | Status: DC

## 2021-04-27 NOTE — Assessment & Plan Note (Signed)
Stable, continue statin 

## 2021-04-27 NOTE — Progress Notes (Signed)
Established Patient Office Visit  Subjective:  Patient ID: Charlotte Thompson, female    DOB: 04/15/1965  Age: 56 y.o. MRN: [id number]  CC:  Chief Complaint  Patient presents with   Depression    Does not feel like meds are working any longer    HPI Charlotte Thompson presents for follow up on chronic medical conditions.   Hypothyroidism: -Medications: Levothyroxine 112 -Patient is compliant with the above medication (s) at the above dose and reports no medication side effects.  -Denies weight changes, cold./heat intolerance, skin changes, anxiety/palpitations  -Last TSH: 1.89 8/22  MDD: -Mood status: uncontrolled -Current treatment: Seroquel 50, Effexor 225 XR -Satisfied with current treatment?: no -Symptom severity: severe  -Duration of current treatment : years Effexor 225 for some time, on for hot flashes (helps with those somewhat but not completely gone). Also on Seroquel  -Side effects: no Medication compliance: excellent compliance Psychotherapy/counseling: yes current, twice a week  Previous psychiatric medications: lexapro, prozac, and zoloft Depressed mood: yes Anxious mood: yes Anhedonia: yes Significant weight loss or gain: no Insomnia: yes hard to stay asleep Fatigue: yes Suicidal ideations:  passive suicidal ideations Hopelessness: yes Crying spells: yes Depression screen [facility] 2/9 04/27/2021 12/01/2020 11/11/2020 04/28/2020 10/18/2019  Decreased Interest 3 3 3 3  0  Down, Depressed, Hopeless 3 3 3 1  0  PHQ - 2 Score 6 6 6 4  0  Altered sleeping 3 3 3 3 1   Tired, decreased energy 3 3 3 3 1   Change in appetite 2 3 3 3  0  Feeling bad or failure about yourself  3 2 2 1  0  Trouble concentrating 2 2 2  0 0  Moving slowly or fidgety/restless 2 1 1 1  0  Suicidal thoughts 2 0 2 0 0  PHQ-9 Score 23 20 22 15 2   Difficult doing work/chores Extremely dIfficult Somewhat difficult Somewhat difficult - Not difficult at all  Some recent data might be hidden    HLD: -Medications: Crestor 20 -Patient is compliant with above medications and reports no side effects.  -Last lipid panel: 2/22 Lipid Panel     Component Value Date/Time   CHOL 178 04/28/2020 0859   CHOL 154 06/16/2015 1659   TRIG 130 04/28/2020 0859   HDL 55 04/28/2020 0859   HDL 45 06/16/2015 1659   CHOLHDL 3.2 04/28/2020 0859   VLDL 50 (H) 06/29/2016 1138   LDLCALC 100 (H) 04/28/2020 0859   LABVLDL 28 06/16/2015 1659    Past Medical History:  Diagnosis Date   Aggressive behavior 06/29/2016   Benign cyst of skin 10/18/2014   Family history of chronic renal disease 06/16/2015   GERD (gastroesophageal reflux disease)    Hemorrhoid    Hx of iron deficiency anemia 06/16/2015   Hyperlipidemia    Pneumonia    Prolonged grief reaction 09/23/2016    Past Surgical History:  Procedure Laterality Date   COLONOSCOPY     PARTIAL HYSTERECTOMY  2010    Family History  Problem Relation Age of Onset   Autoimmune disease Father    Kidney failure Father    Heart disease Father    Heart attack Father    Hypertension Father    Hyperlipidemia Father    Clotting disorder Mother    Kidney disease Mother    Heart attack Mother    Thyroid disease Sister    Cancer Sister        breast   Breast cancer Sister 75   Thyroid disease Daughter  Diabetes Paternal Grandmother    Diabetes type II Paternal Grandmother    Hypertension Daughter    Hearing loss Maternal Grandmother    Cancer Maternal Grandmother        colon   Cancer Maternal Grandfather        prostate   Pneumonia Paternal Grandfather    Cancer Paternal Grandfather        bone    Social History   Socioeconomic History   Marital status: Married    Spouse name: [name]   Number of children: Not on file   Years of education: Not on file   Highest education level: Not on file  Occupational History   Not on file  Tobacco Use   Smoking status: Every Day    Years: 16.00    Types: Cigarettes    Start date: 03/24/2014    Smokeless tobacco: Never  Vaping Use   Vaping Use: Never used  Substance and Sexual Activity   Alcohol use: No    Alcohol/week: 0.0 standard drinks   Drug use: No   Sexual activity: Not Currently  Other Topics Concern   Not on file  Social History Narrative   Not on file   Social Determinants of Health   Financial Resource Strain: High Risk   Difficulty of Paying Living Expenses: Hard  Food Insecurity: No Food Insecurity   Worried About Running Out of Food in the Last Year: Never true   Ran Out of Food in the Last Year: Never true  Transportation Needs: No Transportation Needs   Lack of Transportation (Medical): No   Lack of Transportation (Non-Medical): No  Physical Activity: Insufficiently Active   Days of Exercise per Week: 7 days   Minutes of Exercise per Session: 20 min  Stress: Stress Concern Present   Feeling of Stress : Very much  Social Connections: Moderately Integrated   Frequency of Communication with Friends and Family: More than three times a week   Frequency of Social Gatherings with Friends and Family: More than three times a week   Attends Religious Services: More than 4 times per year   Active Member of [employer] or Organizations: No   Attends Banker Meetings: Never   Marital Status: Married  Catering manager Violence: Not At Risk   Fear of Current or Ex-Partner: No   Emotionally Abused: No   Physically Abused: No   Sexually Abused: No    Outpatient Medications Prior to Visit  Medication Sig Dispense Refill   docusate sodium (COLACE) 50 MG capsule Take 50 mg by mouth daily as needed for mild constipation.     fluticasone (FLONASE) 50 MCG/ACT nasal spray Place into both nostrils.     levothyroxine (SYNTHROID) 112 MCG tablet Take 1 tablet (112 mcg total) by mouth daily. 90 tablet 3   QUEtiapine (SEROQUEL) 50 MG tablet TAKE 1 TABLET BY MOUTH EVERY NIGHT AT BEDTIME 90 tablet 1   rosuvastatin (CRESTOR) 20 MG tablet Take 1 tablet (20 mg total)  by mouth daily. 90 tablet 3   venlafaxine XR (EFFEXOR-XR) 75 MG 24 hr capsule Take 3 capsules (225 mg total) by mouth daily with breakfast. 270 capsule 1   No facility-administered medications prior to visit.    Allergies  Allergen Reactions   Aspirin Nausea And Vomiting   Codeine Hives    ROS Review of Systems  Psychiatric/Behavioral:  Positive for sleep disturbance and suicidal ideas. The patient is nervous/anxious.      Objective:  Physical Exam Constitutional:      Appearance: Normal appearance.  HENT:     Head: Normocephalic and atraumatic.  Eyes:     Conjunctiva/sclera: Conjunctivae normal.  Cardiovascular:     Rate and Rhythm: Normal rate and regular rhythm.  Pulmonary:     Effort: Pulmonary effort is normal.     Breath sounds: Normal breath sounds.  Skin:    General: Skin is warm and dry.  Neurological:     General: No focal deficit present.     Mental Status: She is alert. Mental status is at baseline.  Psychiatric:     Comments: tearful    BP 126/72    Pulse 92    Temp 98 F (36.7 C)    Resp 16    Ht 5\' 5"  (1.651 m)    Wt 168 lb 8 oz (76.4 kg)    SpO2 97%    BMI 28.04 kg/m  Wt Readings from Last 3 Encounters:  04/27/21 168 lb 8 oz (76.4 kg)  11/11/20 169 lb 6.4 oz (76.8 kg)  04/28/20 165 lb 12.8 oz (75.2 kg)     Health Maintenance Due  Topic Date Due   Zoster Vaccines- Shingrix (1 of 2) Never done   COLONOSCOPY (Pts 45-2222yrs Insurance coverage will need to be confirmed)  Never done   MAMMOGRAM  09/28/2017   COVID-19 Vaccine (4 - Booster for Moderna series) 05/18/2020    There are no preventive care reminders to display for this patient.  Lab Results  Component Value Date   TSH 1.89 11/11/2020   Lab Results  Component Value Date   WBC 8.5 04/28/2020   HGB 13.6 04/28/2020   HCT 40.2 04/28/2020   MCV 95.0 04/28/2020   PLT 225 04/28/2020   Lab Results  Component Value Date   NA 141 04/28/2020   K 4.3 04/28/2020   CO2 28 04/28/2020    GLUCOSE 93 04/28/2020   BUN 8 04/28/2020   CREATININE 0.82 04/28/2020   BILITOT 0.3 04/28/2020   ALKPHOS 62 06/29/2016   AST 14 04/28/2020   ALT 14 04/28/2020   PROT 6.4 04/28/2020   ALBUMIN 3.8 06/29/2016   CALCIUM 9.4 04/28/2020   ANIONGAP 9 07/24/2018   Lab Results  Component Value Date   CHOL 178 04/28/2020   Lab Results  Component Value Date   HDL 55 04/28/2020   Lab Results  Component Value Date   LDLCALC 100 (H) 04/28/2020   Lab Results  Component Value Date   TRIG 130 04/28/2020   Lab Results  Component Value Date   CHOLHDL 3.2 04/28/2020   Lab Results  Component Value Date   HGBA1C 5.6 11/11/2020      Assessment & Plan:   Problem List Items Addressed This Visit       Endocrine   Hypothyroidism (Chronic)    Stable, last TSH controlled, continue medication.        Other   Hypercholesterolemia    Stable, continue statin.      Major depressive disorder, recurrent (HCC) - Primary    Uncontrolled. On maximum dose of Effexor, increase Seroquel to 100 mg daily. Admits to passive SI, no plans. Referral to Psychiatry placed today to help with medications. The patient is currently in therapy twice a week but doesn't feel like anything is helping. Follow up here in 6 weeks or sooner as needed.      Relevant Medications   QUEtiapine (SEROQUEL) 100 MG tablet   Other Relevant Orders  Ambulatory referral to Psychiatry     Follow-up: Return in about 6 weeks (around 06/08/2021).    Margarita Mail, DO

## 2021-04-27 NOTE — Patient Instructions (Addendum)
It was great seeing you today!  Plan discussed at today's visit: -Increase Seroquel to 100 mg a night -Continue Effexor at current dose -Referral to Psychiatry placed today   Follow up in: 6 weeks   Take care and let us know if you have any questions or concerns prior to your next visit.  Dr. Rosana Berger

## 2021-04-27 NOTE — Assessment & Plan Note (Signed)
Stable, last TSH controlled, continue medication.

## 2021-04-27 NOTE — Assessment & Plan Note (Signed)
Uncontrolled. On maximum dose of Effexor, increase Seroquel to 100 mg daily. Admits to passive SI, no plans. Referral to Psychiatry placed today to help with medications. The patient is currently in therapy twice a week but doesn't feel like anything is helping. Follow up here in 6 weeks or sooner as needed.

## 2021-06-08 ENCOUNTER — Ambulatory Visit: Payer: Managed Care, Other (non HMO) | Admitting: Internal Medicine

## 2021-06-22 ENCOUNTER — Other Ambulatory Visit: Payer: Self-pay

## 2021-06-22 DIAGNOSIS — Z1231 Encounter for screening mammogram for malignant neoplasm of breast: Secondary | ICD-10-CM

## 2021-08-05 ENCOUNTER — Encounter: Payer: Self-pay | Admitting: Family Medicine

## 2021-08-25 ENCOUNTER — Encounter: Payer: Self-pay | Admitting: Family Medicine

## 2021-09-08 ENCOUNTER — Encounter: Payer: Self-pay | Admitting: Family Medicine

## 2021-11-09 ENCOUNTER — Encounter: Payer: Self-pay | Admitting: Family Medicine

## 2021-12-12 ENCOUNTER — Other Ambulatory Visit: Payer: Self-pay | Admitting: Family Medicine

## 2021-12-12 DIAGNOSIS — E7800 Pure hypercholesterolemia, unspecified: Secondary | ICD-10-CM

## 2021-12-14 NOTE — Telephone Encounter (Signed)
Requested medications are due for refill today.  yes  Requested medications are on the active medications list.  yes  Last refill. 11/11/2020 #90 3 rf  Future visit scheduled.   no  Notes to clinic.  Labs are expired.    Requested Prescriptions  Pending Prescriptions Disp Refills   rosuvastatin (CRESTOR) 20 MG tablet [Pharmacy Med Name: ROSUVASTATIN 20MG  TABLETS] 90 tablet 3    Sig: TAKE 1 TABLET(20 MG) BY MOUTH DAILY     Cardiovascular:  Antilipid - Statins 2 Failed - 12/12/2021  6:21 AM      Failed - Cr in normal range and within 360 days    Creat  Date Value Ref Range Status  04/28/2020 0.82 0.50 - 1.05 mg/dL Final    Comment:    For patients >59 years of age, the reference limit for Creatinine is approximately 13% higher for people identified as African-American. .    Creatinine, Urine  Date Value Ref Range Status  03/10/2016 198 20 - 320 mg/dL Final         Failed - Lipid Panel in normal range within the last 12 months    Cholesterol, Total  Date Value Ref Range Status  06/16/2015 154 100 - 199 mg/dL Final   Cholesterol  Date Value Ref Range Status  04/28/2020 178 <200 mg/dL Final   LDL Cholesterol (Calc)  Date Value Ref Range Status  04/28/2020 100 (H) mg/dL (calc) Final    Comment:    Reference range: <100 . Desirable range <100 mg/dL for primary prevention;   <70 mg/dL for patients with CHD or diabetic patients  with > or = 2 CHD risk factors. Marland Kitchen LDL-C is now calculated using the Martin-Hopkins  calculation, which is a validated novel method providing  better accuracy than the Friedewald equation in the  estimation of LDL-C.  Cresenciano Genre et al. Annamaria Helling. 5093;267(12): 2061-2068  (http://education.[facility].com/faq/FAQ164)    HDL  Date Value Ref Range Status  04/28/2020 55 > OR = 50 mg/dL Final  06/16/2015 45 >39 mg/dL Final   Triglycerides  Date Value Ref Range Status  04/28/2020 130 <150 mg/dL Final         Passed - Patient is not  pregnant      Passed - Valid encounter within last 12 months    Recent Outpatient Visits           7 months ago Severe episode of recurrent major depressive disorder, without psychotic features [facility])   [place], DO   1 year ago Vitamin B12 deficiency   [facility] Myles Gip, DO   1 year ago Major depressive disorder in remission, unspecified whether recurrent [facility])   [facility] Delsa Grana, PA-C   2 years ago Major depressive disorder in remission, unspecified whether recurrent [facility])   [facility] Lebron Conners D, MD   2 years ago Moderate episode of recurrent major depressive disorder [facility])   [facility] Delsa Grana, [place]

## 2022-01-11 ENCOUNTER — Encounter: Payer: Self-pay | Admitting: Family Medicine

## 2022-01-11 ENCOUNTER — Ambulatory Visit: Payer: Managed Care, Other (non HMO) | Admitting: Family Medicine

## 2022-01-11 VITALS — BP 138/84 | HR 96 | Temp 98.4°F | Resp 16 | Ht 65.0 in | Wt 153.7 lb

## 2022-01-11 DIAGNOSIS — Z23 Encounter for immunization: Secondary | ICD-10-CM

## 2022-01-11 DIAGNOSIS — R519 Headache, unspecified: Secondary | ICD-10-CM

## 2022-01-11 DIAGNOSIS — E7800 Pure hypercholesterolemia, unspecified: Secondary | ICD-10-CM

## 2022-01-11 DIAGNOSIS — E038 Other specified hypothyroidism: Secondary | ICD-10-CM | POA: Diagnosis not present

## 2022-01-11 DIAGNOSIS — Z5181 Encounter for therapeutic drug level monitoring: Secondary | ICD-10-CM

## 2022-01-11 DIAGNOSIS — E538 Deficiency of other specified B group vitamins: Secondary | ICD-10-CM

## 2022-01-11 DIAGNOSIS — F332 Major depressive disorder, recurrent severe without psychotic features: Secondary | ICD-10-CM | POA: Diagnosis not present

## 2022-01-11 DIAGNOSIS — G43909 Migraine, unspecified, not intractable, without status migrainosus: Secondary | ICD-10-CM

## 2022-01-11 MED ORDER — NURTEC 75 MG PO TBDP: 75.0000 mg | ORAL_TABLET | Freq: Once | ORAL | 0 refills | Status: DC | PRN

## 2022-01-11 MED ORDER — ROSUVASTATIN CALCIUM 20 MG PO TABS: 20.0000 mg | ORAL_TABLET | Freq: Every day | ORAL | 3 refills | Status: DC

## 2022-01-11 MED ORDER — PROMETHAZINE HCL 25 MG PO TABS: 25.0000 mg | ORAL_TABLET | Freq: Four times a day (QID) | ORAL | 1 refills | Status: DC | PRN

## 2022-01-11 MED ORDER — SUMATRIPTAN SUCCINATE 100 MG PO TABS: 100.0000 mg | ORAL_TABLET | ORAL | 0 refills | Status: DC | PRN

## 2022-01-11 MED ORDER — LEVOTHYROXINE SODIUM 112 MCG PO TABS: 112.0000 ug | ORAL_TABLET | Freq: Every day | ORAL | 3 refills | Status: DC

## 2022-01-11 NOTE — Assessment & Plan Note (Signed)
    01/11/2022    3:55 PM 04/27/2021    3:43 PM 12/01/2020    9:28 AM  Depression screen PHQ 2/9  Decreased Interest 0 3 3  Down, Depressed, Hopeless 1 3 3   PHQ - 2 Score 1 6 6   Altered sleeping 1 3 3   Tired, decreased energy 1 3 3   Change in appetite 1 2 3   Feeling bad or failure about yourself  0 3 2  Trouble concentrating 0 2 2  Moving slowly or fidgety/restless 0 2 1  Suicidal thoughts 0 2 0  PHQ-9 Score 4 23 20   Difficult doing work/chores Somewhat difficult Extremely dIfficult Somewhat difficult  mood much better (OCD, anxiety/depression) Now seeing psychiatry phq 9 sig improved, neg today I believe she is on Effexor and seroquel (celexa on chart but not prescribed since May 2023 #30)

## 2022-01-11 NOTE — Assessment & Plan Note (Signed)
Compliant with meds, no SE, no myalgias, fatigue or jaundice Due for lipid panel and CMP Diet and exercise recommendations for HLD reviewed Continue crestor 20 daily

## 2022-01-11 NOTE — Progress Notes (Signed)
Name: Charlotte Thompson   MRN: [id number]    DOB: 1965-11-03   Date:01/11/2022       Progress Note  Chief Complaint  Patient presents with   Follow-up   Hyperlipidemia   Depression     Subjective:   Charlotte Thompson is a 56 y.o. female, presents to clinic for routine f/up and med refill  Psych -  seeing Dr. Maryruth Bun Med doses adjusted and updated on celexa 20, seroquel 50 and venlafaxine 150  Hypothyroidism: On 112 mcg daily  Takes medicine in the am on empty stomach Current Symptoms: denies fatigue, weight changes, heat/cold intolerance, bowel/skin changes or CVS symptoms Most recent results are below; we will be repeating labs today. Lab Results  Component Value Date   TSH 1.89 11/11/2020   Hyperlipidemia: Currently treated with crestor 20 mg, pt reports good med compliance, no SE or concerns from pt Last Lipids: Lab Results  Component Value Date   CHOL 178 04/28/2020   HDL 55 04/28/2020   LDLCALC 100 (H) 04/28/2020   TRIG 130 04/28/2020   CHOLHDL 3.2 04/28/2020  - Denies: Chest pain, shortness of breath, myalgias, claudication  HA's- she reports long hx of chronic daily HA's and migraines - not in chart problem list or on Rx meds for this She reports recently worsening HA's daily with migaines and usingUsing high (OD doses (4-6 or more at a time) excedrine   Gaylyn Rong is located forehead to crown of head, worse with lights and severe sometimes waking in the middle of the night Sumatriptan used to work (been decades - used to be prescribed a shot) Currently doing excedrine migraine  HA daily, massive migraines every other week  Previously consulted with HA specialists in [place]   Chart reviewed and did not find any mention with her prior PCP But there was a consult with neurology at [facility] that mentioned HA/migraine hx and a past CT brain 2010 for HA     Current Outpatient Medications:    citalopram (CELEXA) 20 MG tablet, Take 20 mg by mouth daily., Disp: ,  Rfl:    QUEtiapine (SEROQUEL) 50 MG tablet, Take 50 mg by mouth at bedtime., Disp: , Rfl:    venlafaxine XR (EFFEXOR-XR) 150 MG 24 hr capsule, Take 150 mg by mouth daily with breakfast., Disp: , Rfl:    levothyroxine (SYNTHROID) 112 MCG tablet, Take 1 tablet (112 mcg total) by mouth daily., Disp: 90 tablet, Rfl: 3   QUEtiapine (SEROQUEL) 100 MG tablet, Take 1 tablet (100 mg total) by mouth at bedtime. 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[facility]) 10/07/2016   Vitamin D deficiency 06/29/2016   Vitamin B12 deficiency 06/29/2016   Microalbuminuria 12/27/2015   Insomnia 12/27/2015   Anxiety and depression 12/05/2015   Hypothyroidism 06/16/2015   Tobacco abuse 06/16/2015   Hypercholesterolemia 06/16/2015   Heart murmur 06/16/2015   Hot flashes, menopausal 09/02/2014    Past Surgical History:  Procedure Laterality Date   COLONOSCOPY     PARTIAL HYSTERECTOMY  2010    Family History  Problem Relation Age of Onset   Autoimmune disease Father    Kidney failure Father    Heart disease Father    Heart  attack Father    Hypertension Father    Hyperlipidemia Father    Clotting disorder Mother    Kidney disease Mother    Heart attack Mother    Thyroid disease Sister    Cancer Sister        breast   Breast cancer Sister 54   Thyroid disease Daughter    Diabetes Paternal Grandmother    Diabetes type II Paternal Grandmother    Hypertension Daughter    Hearing loss Maternal Grandmother    Cancer Maternal Grandmother        colon   Cancer Maternal Grandfather        prostate   Pneumonia Paternal Grandfather    Cancer Paternal Grandfather        bone    Social History   Tobacco Use    Smoking status: Every Day    Years: 16.00    Types: Cigarettes    Start date: 03/24/2014   Smokeless tobacco: Never  Vaping Use   Vaping Use: Never used  Substance Use Topics   Alcohol use: No    Alcohol/week: 0.0 standard drinks of alcohol   Drug use: No     Allergies  Allergen Reactions   Aspirin Nausea And Vomiting   Codeine Hives    Health Maintenance  Topic Date Due   Zoster Vaccines- Shingrix (1 of 2) Never done   COVID-19 Vaccine (4 - Moderna risk series) 05/18/2020   MAMMOGRAM  04/27/2022 (Originally 09/28/2017)   Fecal DNA (Cologuard)  12/30/2023   PAP SMEAR-Modifier  11/11/2025   TETANUS/TDAP  06/30/2026   INFLUENZA VACCINE  Completed   Hepatitis C Screening  Completed   HIV Screening  Completed   HPV VACCINES  Aged Out    Chart Review Today: I personally reviewed active problem list, medication list, allergies, family history, social history, health maintenance, notes from last encounter, lab results, imaging with the patient/caregiver today.   Review of Systems  Constitutional: Negative.   HENT: Negative.    Eyes: Negative.   Respiratory: Negative.    Cardiovascular: Negative.   Gastrointestinal: Negative.   Endocrine: Negative.   Genitourinary: Negative.   Musculoskeletal: Negative.   Skin: Negative.   Allergic/Immunologic: Negative.   Neurological:  Positive for headaches.  Hematological: Negative.   Psychiatric/Behavioral: Negative.    All other systems reviewed and are negative.    Objective:   Vitals:   01/11/22 1559  BP: 138/84  Pulse: 96  Resp: 16  Temp: 98.4 F (36.9 C)  TempSrc: Oral  SpO2: 97%  Weight: 153 lb 11.2 oz (69.7 kg)  Height: 5\' 5"  (1.651 m)    Body mass index is 25.58 kg/m.  Physical Exam Vitals and nursing note reviewed.  Constitutional:      General: She is not in acute distress.    Appearance: Normal appearance. She is well-developed and well-groomed. She is not ill-appearing, toxic-appearing or  diaphoretic.  HENT:     Head: Normocephalic and atraumatic.     Right Ear: External ear normal.     Left Ear: External ear normal.  Eyes:     General: Lids are normal. No scleral icterus.       Right eye: No discharge.        Left eye: No discharge.     Conjunctiva/sclera: Conjunctivae normal.  Neck:     Trachea: Phonation normal. No tracheal deviation.  Cardiovascular:     Rate and Rhythm: Normal rate and regular rhythm.  Pulses: Normal pulses.          Radial pulses are 2+ on the right side and 2+ on the left side.       Posterior tibial pulses are 2+ on the right side and 2+ on the left side.     Heart sounds: Normal heart sounds. No murmur heard.    No friction rub. No gallop.  Pulmonary:     Effort: Pulmonary effort is normal. No respiratory distress.     Breath sounds: Normal breath sounds. No stridor. No wheezing, rhonchi or rales.  Chest:     Chest wall: No tenderness.  Abdominal:     General: Bowel sounds are normal. There is no distension.     Palpations: Abdomen is soft.  Musculoskeletal:     Right lower leg: No edema.     Left lower leg: No edema.  Skin:    General: Skin is warm and dry.     Coloration: Skin is not jaundiced or pale.     Findings: No rash.  Neurological:     Mental Status: She is alert. Mental status is at baseline.     Motor: No abnormal muscle tone.     Gait: Gait normal.  Psychiatric:        Mood and Affect: Mood normal.        Speech: Speech normal.        Behavior: Behavior normal. Behavior is cooperative.         Assessment & Plan:   Problem List Items Addressed This Visit       Endocrine   Hypothyroidism (Chronic)    Due for TSH She has been on 112 mcg dose for several years Labs and refills to be adjusted if needed      Relevant Medications   levothyroxine (SYNTHROID) 112 MCG tablet   Other Relevant Orders   TSH   CBC with Differential/Platelet     Other   Hypercholesterolemia - Primary    Compliant with meds,  no SE, no myalgias, fatigue or jaundice Due for lipid panel and CMP Diet and exercise recommendations for HLD reviewed Continue crestor 20 daily      Relevant Medications   rosuvastatin (CRESTOR) 20 MG tablet   Other Relevant Orders   Lipid panel   Comprehensive metabolic panel   Major depressive disorder, recurrent (HCC)       01/11/2022    3:55 PM 04/27/2021    3:43 PM 12/01/2020    9:28 AM  Depression screen PHQ 2/9  Decreased Interest 0 3 3  Down, Depressed, Hopeless 1 3 3   PHQ - 2 Score 1 6 6   Altered sleeping 1 3 3   Tired, decreased energy 1 3 3   Change in appetite 1 2 3   Feeling bad or failure about yourself  0 3 2  Trouble concentrating 0 2 2  Moving slowly or fidgety/restless 0 2 1  Suicidal thoughts 0 2 0  PHQ-9 Score 4 23 20   Difficult doing work/chores Somewhat difficult Extremely dIfficult Somewhat difficult  mood much better (OCD, anxiety/depression) Now seeing psychiatry phq 9 sig improved, neg today I believe she is on Effexor and seroquel (celexa on chart but not prescribed since May 2023 #30)        Other Visit Diagnoses     Need for influenza vaccination       Refused    Need for shingles vaccine       Refused    B12 deficiency  managed by psychiatry - labs from 2022 B12 low, pt endorses doing shots and supplements   Relevant Orders   CBC with Differential/Platelet   Frequent headaches       she endorses months of daily HA, which she has had previously - even when she was 56 years old, referral to neuro   Relevant Medications   SUMAtriptan (IMITREX) 100 MG tablet      Other Relevant Orders   Ambulatory referral to Neurology   Migraine without status migrainosus, not intractable, unspecified migraine type       recently increased and severe but occuring 2-3 x a month, abortive imitrex trial, pt may also be causing some medication overuse HA's Explained abortive vs preventative meds She can try imitrex OR nurtec sample for  migraines Phenergan trial of associated N/V with migraine    Relevant Medications      SUMAtriptan (IMITREX) 100 MG tablet   promethazine (PHENERGAN) 25 MG tablet   Rimegepant Sulfate (NURTEC) 75 MG TBDP   Other Relevant Orders   Ambulatory referral to Neurology   Encounter for medication monitoring       Relevant Orders   Lipid panel   TSH   CBC with Differential/Platelet   Comprehensive metabolic panel       Given imitrex rx to try - explained thoroughly maximum dosing in 24 hours is 200 mg She is taking 4-6+ excedrine migaine meds - explained she should only be taking one dose per instructions and is likely taking toxic doses of tylenol and caffeine amount may also cause some SE/or harm her.  Explained Medication overuse HA with high doses or daily use of OTC meds - encouraged to reduce use  Sample of nurtec given (2) pills to try for abortive measures for migraine  With severe sx and changes to HA/migraine will refer to neuro  She may need CGRP (would be a process to get approved) encouraged the pt to f/up with me in a month if she wants to try daily preventative meds prior to getting into neuro  Pts sx were concerning - she mentioned at the end of the visit - there is not much hx in the chart - so I asked her to return to discuss/address more There was a prior head CT which was neg - and done for similar sx - so it seems she has hx of similar sx in the past.  Return in about 6 months (around 07/13/2022) for Annual Physical.   Delsa Grana, PA-C 01/11/22 4:12 PM

## 2022-01-11 NOTE — Assessment & Plan Note (Signed)
Due for TSH She has been on 112 mcg dose for several years Labs and refills to be adjusted if needed

## 2022-01-12 ENCOUNTER — Other Ambulatory Visit: Payer: Self-pay | Admitting: Family Medicine

## 2022-01-12 DIAGNOSIS — E038 Other specified hypothyroidism: Secondary | ICD-10-CM

## 2022-01-12 LAB — CBC WITH DIFFERENTIAL/PLATELET
Basophils Absolute: 0 10*3/uL (ref 0.0–0.2)
Basos: 1 %
EOS (ABSOLUTE): 0.1 10*3/uL (ref 0.0–0.4)
Eos: 1 %
Hematocrit: 39.8 % (ref 34.0–46.6)
Hemoglobin: 13.7 g/dL (ref 11.1–15.9)
Immature Grans (Abs): 0 10*3/uL (ref 0.0–0.1)
Immature Granulocytes: 0 %
Lymphocytes Absolute: 1.9 10*3/uL (ref 0.7–3.1)
Lymphs: 24 %
MCH: 31.4 pg (ref 26.6–33.0)
MCHC: 34.4 g/dL (ref 31.5–35.7)
MCV: 91 fL (ref 79–97)
Monocytes Absolute: 0.6 10*3/uL (ref 0.1–0.9)
Monocytes: 7 %
Neutrophils Absolute: 5.3 10*3/uL (ref 1.4–7.0)
Neutrophils: 67 %
Platelets: 244 10*3/uL (ref 150–450)
RBC: 4.36 x10E6/uL (ref 3.77–5.28)
RDW: 12.3 % (ref 11.7–15.4)
WBC: 8 10*3/uL (ref 3.4–10.8)

## 2022-01-12 LAB — LIPID PANEL
Chol/HDL Ratio: 5.3 ratio — ABNORMAL HIGH (ref 0.0–4.4)
Cholesterol, Total: 245 mg/dL — ABNORMAL HIGH (ref 100–199)
HDL: 46 mg/dL (ref >39)
LDL Chol Calc (NIH): 162 mg/dL — ABNORMAL HIGH (ref 0–99)
Triglycerides: 199 mg/dL — ABNORMAL HIGH (ref 0–149)
VLDL Cholesterol Cal: 37 mg/dL (ref 5–40)

## 2022-01-12 LAB — COMPREHENSIVE METABOLIC PANEL
ALT: 15 IU/L (ref 0–32)
AST: 16 IU/L (ref 0–40)
Albumin/Globulin Ratio: 1.9 (ref 1.2–2.2)
Albumin: 4.2 g/dL (ref 3.8–4.9)
Alkaline Phosphatase: 89 IU/L (ref 44–121)
BUN/Creatinine Ratio: 16 (ref 9–23)
BUN: 11 mg/dL (ref 6–24)
Bilirubin Total: <0.2 mg/dL (ref 0.0–1.2)
CO2: 24 mmol/L (ref 20–29)
Calcium: 9.4 mg/dL (ref 8.7–10.2)
Chloride: 104 mmol/L (ref 96–106)
Creatinine, Ser: 0.7 mg/dL (ref 0.57–1.00)
Globulin, Total: 2.2 g/dL (ref 1.5–4.5)
Glucose: 93 mg/dL (ref 70–99)
Potassium: 4.2 mmol/L (ref 3.5–5.2)
Sodium: 141 mmol/L (ref 134–144)
Total Protein: 6.4 g/dL (ref 6.0–8.5)
eGFR: 102 mL/min/{1.73_m2} (ref >59)

## 2022-01-12 LAB — TSH: TSH: 0.088 u[IU]/mL — ABNORMAL LOW (ref 0.450–4.500)

## 2022-01-12 MED ORDER — LEVOTHYROXINE SODIUM 100 MCG PO TABS: 100.0000 ug | ORAL_TABLET | Freq: Every day | ORAL | 1 refills | Status: DC

## 2022-01-13 ENCOUNTER — Other Ambulatory Visit: Payer: Self-pay

## 2022-01-13 DIAGNOSIS — E038 Other specified hypothyroidism: Secondary | ICD-10-CM

## 2022-03-03 ENCOUNTER — Telehealth: Payer: Self-pay

## 2022-03-03 LAB — TSH: TSH: 0.183 u[IU]/mL — ABNORMAL LOW (ref 0.450–4.500)

## 2022-03-03 MED ORDER — LEVOTHYROXINE SODIUM 100 MCG PO TABS: ORAL_TABLET | ORAL | 1 refills | Status: DC

## 2022-03-03 NOTE — Telephone Encounter (Signed)
Called pt no answer left vm to return call due to TSH Lab, per PCP Leisa- Please tell Susie to reduce the dose by taking a half pill 2 d a week  For example Sun, Mon, Wed, Thurs, Sat take 100 mcg whole pill, and Tues and Fri take 1/2 tab 50 mcg   That way she can use up the rest of her 100 mcg dose tablets   Recheck tsh in 6 weeks and then I'll send in new rx once we recheck the labs

## 2022-03-03 NOTE — Addendum Note (Signed)
Addended by: Danelle Berry on: 03/03/2022 02:16 PM   Modules accepted: Orders

## 2022-04-05 NOTE — Telephone Encounter (Signed)
Called pt no answer left vm to return call

## 2022-04-05 NOTE — Telephone Encounter (Signed)
Pt returned call went over her previous TSH lab and she verbalized understanding. Pt will return in 6 weeks once she follows new instructions of meds per leisa

## 2022-04-05 NOTE — Telephone Encounter (Signed)
Sent pt a mychart message but will try to call her again before the day ends

## 2022-04-05 NOTE — Telephone Encounter (Signed)
2nd attempt - no answer

## 2022-04-05 NOTE — Telephone Encounter (Signed)
Pls contact pt [phone] ! Pt did not get this message and did not chg her dose. Wanting to know if she shld reduce med now, is needing a refill as out of med now, What dosage should she been on, pt little upset as never got message, pls fu

## 2022-04-05 NOTE — Telephone Encounter (Signed)
3rd attempt no answer left vm to return my call

## 2022-04-21 ENCOUNTER — Other Ambulatory Visit: Payer: Self-pay

## 2022-04-21 DIAGNOSIS — E038 Other specified hypothyroidism: Secondary | ICD-10-CM

## 2022-05-31 ENCOUNTER — Other Ambulatory Visit: Payer: Self-pay | Admitting: Family Medicine

## 2022-05-31 DIAGNOSIS — G43909 Migraine, unspecified, not intractable, without status migrainosus: Secondary | ICD-10-CM

## 2022-05-31 DIAGNOSIS — R519 Headache, unspecified: Secondary | ICD-10-CM

## 2022-06-02 ENCOUNTER — Encounter: Payer: Self-pay | Admitting: Family Medicine

## 2022-06-04 MED ORDER — SUMATRIPTAN SUCCINATE 100 MG PO TABS: 100.0000 mg | ORAL_TABLET | ORAL | 0 refills | Status: DC | PRN

## 2022-06-08 LAB — TSH: TSH: 0.344 u[IU]/mL — ABNORMAL LOW (ref 0.450–4.500)

## 2022-06-09 NOTE — Telephone Encounter (Signed)
I sent a lab result note about this -  Yes she can do that - it equates to about a 75 mg dose daily - and if labs get into normal range I would change her rx to 75 mcg daily

## 2022-07-08 ENCOUNTER — Other Ambulatory Visit: Payer: Self-pay | Admitting: Family Medicine

## 2022-07-08 DIAGNOSIS — E038 Other specified hypothyroidism: Secondary | ICD-10-CM

## 2022-07-08 MED ORDER — LEVOTHYROXINE SODIUM 75 MCG PO TABS: 75.0000 ug | ORAL_TABLET | Freq: Every day | ORAL | 0 refills | Status: DC

## 2022-07-08 NOTE — Telephone Encounter (Signed)
Medication Refill - Medication: levothyroxine (SYNTHROID) 100 MCG tablet   Has the patient contacted their pharmacy? Yes.   Patient had no refills left and she needed to call provider.  Preferred Pharmacy (with phone number or street name):  [id number] [place], [place] - [address] AT [facility] [address] Phone: [phone]  Fax: [phone]     Has the patient been seen for an appointment in the last year OR does the patient have an upcoming appointment? Yes.    Agent: Please be advised that RX refills may take up to 3 business days. We ask that you follow-up with your pharmacy.  *Patient is asking for clarification on the correct dose she should be taking. Patient was notified after her labs on 3/25 that her TSH was still to low even with taking half a dose 2 days a week. Please advise.

## 2022-07-08 NOTE — Progress Notes (Signed)
Dose changes due to suppressed TSH Last dose change was to take 100 mcg and then 1/2 tab 50 mcg every other day to use up her remaining Rx Prior refills were edited and "no print" meaning they did not go to the pharmacy - so she is probably out of meds and I have changed the Rx to reflect te 75 mcg daily average. She is still due for recheck of Tsh to ensure it is in normal range and after that more refills and #90 supply will be sent in. Was on 112 daily Then 100 mcg daily Then 100 mcg 5d a week and 50 mcg 2 d a week Last decreased to 100 mcg every other day with 50 mcg (1/2 tab) every other day Lab Results  Component Value Date   TSH 0.344 (L) 06/07/2022   TSH 0.183 (L) 03/02/2022   TSH 0.088 (L) 01/11/2022   TSH 1.89 11/11/2020   TSH 2.19 07/23/2020   TSH 32.56 (H) 04/28/2020   Was due for labs in 6-8 weeks It is now a month later One month of 75 mcg meds sent in    ICD-10-CM   1. Other specified hypothyroidism  E03.8 levothyroxine (SYNTHROID) 75 MCG tablet      Danelle Berry, PA-C

## 2022-07-08 NOTE — Telephone Encounter (Signed)
Requested medications are due for refill today.  unsure  Requested medications are on the active medications list.  yes  Last refill. 03/03/2022 #90 1 rf  Future visit scheduled.   yes  Notes to clinic.  Abnormal labs.    Requested Prescriptions  Pending Prescriptions Disp Refills   levothyroxine (SYNTHROID) 100 MCG tablet 90 tablet 1    Sig: Take 100 mcg po qam on empty stomach 5 d a week and take 1/2 tablet 50 mcg po qam on empty stomach 2 d a week     Endocrinology:  Hypothyroid Agents Failed - 07/08/2022 10:16 AM      Failed - TSH in normal range and within 360 days    TSH  Date Value Ref Range Status  06/07/2022 0.344 (L) 0.450 - 4.500 uIU/mL Final         Passed - Valid encounter within last 12 months    Recent Outpatient Visits           5 months ago Hypercholesterolemia   [facility] Danelle Berry, PA-C   1 year ago Severe episode of recurrent major depressive disorder, without psychotic features [facility])   [facility] Margarita Mail, DO   1 year ago Vitamin B12 deficiency   [facility] Caro Laroche, DO   2 years ago Major depressive disorder in remission, unspecified whether recurrent [facility])   [facility] Danelle Berry, PA-C   2 years ago Major depressive disorder in remission, unspecified whether recurrent [facility])   [place] [facility] Jamelle Haring, MD       Future Appointments             In 5 days Danelle Berry, PA-C [facility], [facility]

## 2022-07-13 ENCOUNTER — Ambulatory Visit: Payer: Managed Care, Other (non HMO) | Admitting: Family Medicine

## 2022-07-13 ENCOUNTER — Encounter: Payer: Self-pay | Admitting: Family Medicine

## 2022-07-13 VITALS — BP 132/82 | HR 87 | Temp 97.9°F | Resp 16 | Ht 65.0 in | Wt 155.3 lb

## 2022-07-13 DIAGNOSIS — F332 Major depressive disorder, recurrent severe without psychotic features: Secondary | ICD-10-CM | POA: Diagnosis not present

## 2022-07-13 DIAGNOSIS — F32A Depression, unspecified: Secondary | ICD-10-CM

## 2022-07-13 DIAGNOSIS — E7800 Pure hypercholesterolemia, unspecified: Secondary | ICD-10-CM

## 2022-07-13 DIAGNOSIS — F419 Anxiety disorder, unspecified: Secondary | ICD-10-CM | POA: Diagnosis not present

## 2022-07-13 DIAGNOSIS — E038 Other specified hypothyroidism: Secondary | ICD-10-CM | POA: Diagnosis not present

## 2022-07-13 DIAGNOSIS — N951 Menopausal and female climacteric states: Secondary | ICD-10-CM

## 2022-07-13 DIAGNOSIS — R519 Headache, unspecified: Secondary | ICD-10-CM

## 2022-07-13 DIAGNOSIS — G43909 Migraine, unspecified, not intractable, without status migrainosus: Secondary | ICD-10-CM

## 2022-07-13 DIAGNOSIS — Z5181 Encounter for therapeutic drug level monitoring: Secondary | ICD-10-CM

## 2022-07-13 MED ORDER — NURTEC 75 MG PO TBDP: 1.0000 | ORAL_TABLET | ORAL | 1 refills | Status: DC

## 2022-07-13 MED ORDER — SUMATRIPTAN SUCCINATE 100 MG PO TABS
100.0000 mg | ORAL_TABLET | ORAL | 5 refills | Status: DC | PRN
Start: 2022-07-13 — End: 2023-07-11

## 2022-07-13 NOTE — Progress Notes (Unsigned)
Name: Charlotte Thompson   MRN: [id number]    DOB: May 11, 1965   Date:07/13/2022       Progress Note  Chief Complaint  Patient presents with   Follow-up   Hypothyroidism   Hyperlipidemia   Depression     Subjective:   Charlotte Thompson is a 57 y.o. female, presents to clinic for routine f/up   Ha's still very persistent and often more severe than in the past-  Imitrex dose is not getting rid of headaches -she is limited to 9 pills with the 100 mg dose each having at least 8-10 headaches a month the current dosing when she takes 100 mg and repeat in 2 hours is only allowing her to have any abortive treatment for 4-5 headaches a month she is also trying over-the-counter Excedrin Migraine  She has been working with her psychiatrist on reducing her medications including her citalopram, Seroquel, Effexor  Cholesterol-managed with rosuvastatin 20 mg daily  Levothyroxine her TSH has been suppressed and we have been slowly decreasing her dose Her last discussion through MyChart and result notes she was last supposed to decrease her 100 mcg levothyroxine dose from full dose 5 days a week and half dose 2 days a week to every other day dosing (100 mcg 1 day and half tablet 50 mcg dosing the next)  We believe she has been doing this for some time and when labs were rechecked the TSH was still low Unfortunately there has been confusion with various places of documentation in the chart on what she has actually been taking she has not started taking an average of 75 mcg daily until her prescription was recently sent in for her.  Only for few days has she been taking 75 mcg daily Lab Results  Component Value Date   TSH 0.344 (L) 06/07/2022      Current Outpatient Medications:    citalopram (CELEXA) 20 MG tablet, Take 20 mg by mouth daily., Disp: , Rfl:    levothyroxine (SYNTHROID) 75 MCG tablet, Take 1 tablet (75 mcg total) by mouth daily before breakfast., Disp: 30 tablet, Rfl: 0   QUEtiapine  (SEROQUEL) 50 MG tablet, Take 25 mg by mouth at bedtime., Disp: , Rfl:    rosuvastatin (CRESTOR) 20 MG tablet, Take 1 tablet (20 mg total) by mouth daily., Disp: 90 tablet, Rfl: 3   SUMAtriptan (IMITREX) 100 MG tablet, Take 1 tablet (100 mg total) by mouth every 2 (two) hours as needed for migraine. May repeat in 2 hours if headache persists or recurs, max 200 mg in 24 hours, Disp: 10 tablet, Rfl: 0   venlafaxine XR (EFFEXOR-XR) 150 MG 24 hr capsule, Take 75 mg by mouth daily with breakfast., Disp: , Rfl:    promethazine (PHENERGAN) 25 MG tablet, Take 1 tablet (25 mg total) by mouth every 6 (six) hours as needed for nausea or vomiting. Can cause sleepiness, do not drive or take at work (Patient not taking: Reported on 07/13/2022), Disp: 12 tablet, Rfl: 1   Rimegepant Sulfate (NURTEC) 75 MG TBDP, Take 75 mg by mouth once as needed. 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[facility]) 10/07/2016   Vitamin D deficiency 06/29/2016   Vitamin B12 deficiency 06/29/2016   Microalbuminuria 12/27/2015   Insomnia 12/27/2015   Anxiety and depression 12/05/2015   Hypothyroidism 06/16/2015  Tobacco abuse 06/16/2015   Hypercholesterolemia 06/16/2015   Hot flashes, menopausal 09/02/2014    Past Surgical History:  Procedure Laterality Date   COLONOSCOPY     PARTIAL HYSTERECTOMY  2010    Family History  Problem Relation Age of Onset   Autoimmune disease Father    Kidney failure Father    Heart disease Father    Heart attack Father    Hypertension Father    Hyperlipidemia Father    Clotting disorder Mother    Kidney disease Mother    Heart attack Mother    Thyroid disease Sister    Cancer Sister        breast   Breast cancer Sister 30   Thyroid disease Daughter    Diabetes Paternal Grandmother    Diabetes type II  Paternal Grandmother    Hypertension Daughter    Hearing loss Maternal Grandmother    Cancer Maternal Grandmother        colon   Cancer Maternal Grandfather        prostate   Pneumonia Paternal Grandfather    Cancer Paternal Grandfather        bone    Social History   Tobacco Use   Smoking status: Every Day    Years: 16    Types: Cigarettes    Start date: 03/24/2014   Smokeless tobacco: Never  Vaping Use   Vaping Use: Never used  Substance Use Topics   Alcohol use: No    Alcohol/week: 0.0 standard drinks of alcohol   Drug use: No     Allergies  Allergen Reactions   Aspirin Nausea And Vomiting   Codeine Hives    Health Maintenance  Topic Date Due   COVID-19 Vaccine (4 - 2023-24 season) 07/29/2022 (Originally 11/13/2021)   Zoster Vaccines- Shingrix (1 of 2) 10/12/2022 (Originally 12/13/2016)   MAMMOGRAM  07/13/2023 (Originally 09/28/2017)   INFLUENZA VACCINE  10/14/2022   Fecal DNA (Cologuard)  12/30/2023   PAP SMEAR-Modifier  11/11/2025   DTaP/Tdap/Td (2 - Td or Tdap) 06/30/2026   Hepatitis C Screening  Completed   HIV Screening  Completed   HPV VACCINES  Aged Out    Chart Review Today: I personally reviewed active problem list, medication list, allergies, family history, social history, health maintenance, notes from last encounter, lab results, imaging with the patient/caregiver today.   Review of Systems  Constitutional: Negative.   HENT: Negative.    Eyes: Negative.   Respiratory: Negative.    Cardiovascular: Negative.   Gastrointestinal: Negative.   Endocrine: Negative.   Genitourinary: Negative.   Musculoskeletal: Negative.   Skin: Negative.   Allergic/Immunologic: Negative.   Neurological: Negative.   Hematological: Negative.   Psychiatric/Behavioral: Negative.    All other systems reviewed and are negative.    Objective:   Vitals:   07/13/22 1521  BP: 132/82  Pulse: 87  Resp: 16  Temp: 97.9 F (36.6 C)  TempSrc: Oral  SpO2: 98%   Weight: 155 lb 4.8 oz (70.4 kg)  Height: 5\' 5"  (1.651 m)    Body mass index is 25.84 kg/m.  Physical Exam Vitals and nursing note reviewed.  Constitutional:      General: She is not in acute distress.    Appearance: Normal appearance. She is well-developed. She is not ill-appearing, toxic-appearing or diaphoretic.  HENT:     Head: Normocephalic and atraumatic.     Nose: Nose normal.  Eyes:     General:  Right eye: No discharge.        Left eye: No discharge.     Conjunctiva/sclera: Conjunctivae normal.  Neck:     Trachea: No tracheal deviation.  Cardiovascular:     Rate and Rhythm: Normal rate and regular rhythm.     Pulses: Normal pulses.     Heart sounds: Normal heart sounds.  Pulmonary:     Effort: Pulmonary effort is normal. No respiratory distress.     Breath sounds: Normal breath sounds. No stridor.  Skin:    General: Skin is warm and dry.     Findings: No rash.  Neurological:     Mental Status: She is alert. Mental status is at baseline.     Motor: No abnormal muscle tone.     Coordination: Coordination normal.  Psychiatric:        Mood and Affect: Mood normal.        Behavior: Behavior normal.         Assessment & Plan:   Problem List Items Addressed This Visit       Cardiovascular and Mediastinum   Hot flashes, menopausal (Chronic)    Poorly controlled since decreasing Effexor dose but she would prefer to decrease med doses and she is adjusting meds with her psychiatrist Clonidine may be an option        Endocrine   Hypothyroidism - Primary (Chronic)    TSH has been suppressed and we have slowly been decreasing levothyroxine dose She recently started 75 mcg daily will need to recheck TSH after 6 weeks of this lower dose and then adjust meds or send in refills depending on the lab results Lab Results  Component Value Date   TSH 0.344 (L) 06/07/2022   TSH 0.183 (L) 03/02/2022   TSH 0.088 (L) 01/11/2022   TSH 1.89 11/11/2020   TSH 2.19  07/23/2020   TSH 32.56 (H) 04/28/2020        Relevant Orders   TSH   T4, free     Other   Hypercholesterolemia    On crestor daily, good compliance no SE or concerns at this time We will recheck her lipids when she returns in a few weeks to recheck her thyroid labs Last labs checked was not well-controlled Lab Results  Component Value Date   CHOL 245 (H) 01/11/2022   HDL 46 01/11/2022   LDLCALC 162 (H) 01/11/2022   TRIG 199 (H) 01/11/2022   CHOLHDL 5.3 (H) 01/11/2022        Relevant Orders   Lipid panel   Comprehensive metabolic panel   Major depressive disorder, recurrent (HCC)    Per psychiatry They have been decreasing doses of her medications including Seroquel, citalopram, venlafaxine      Anxiety and depression    Per psychiatry      Other Visit Diagnoses     Encounter for medication monitoring       Relevant Orders   Lipid panel   TSH   T4, free   Comprehensive metabolic panel   Migraine without status migrainosus, not intractable, unspecified migraine type       She is not getting enough Imitrex, limited to 9 pills a month, dosing is for 1 to 2 pills at the onset of headache trying to use less than 8 x a month   Relevant Medications   Rimegepant Sulfate (NURTEC) 75 MG TBDP   SUMAtriptan (IMITREX) 100 MG tablet   Frequent headaches       She may need  sleep study or neuro consult   Relevant Medications   Rimegepant Sulfate (NURTEC) 75 MG TBDP   SUMAtriptan (IMITREX) 100 MG tablet   Migraine without status migrainosus, not intractable, unspecified migraine type       worse frequency and continued severity, sleep study? may improve with thyroid med adjustements, SE vs sx of psych meds/dx, nurtec trial? needs neuro if unable to control or improve with multiple visits and meds   Relevant Medications   Rimegepant Sulfate (NURTEC) 75 MG TBDP   SUMAtriptan (IMITREX) 100 MG tablet       Labs due around June 10th for thyroid recheck  Lipids rechecked at  that time  May need f/up pending lab results, HA sx/response to meds (f/up in 1-3 months if not improvement) Otherwise 6 month routine f/up appt requested   Danelle Berry, PA-C 07/13/22 3:43 PM

## 2022-07-15 NOTE — Assessment & Plan Note (Signed)
TSH has been suppressed and we have slowly been decreasing levothyroxine dose She recently started 75 mcg daily will need to recheck TSH after 6 weeks of this lower dose and then adjust meds or send in refills depending on the lab results Lab Results  Component Value Date   TSH 0.344 (L) 06/07/2022   TSH 0.183 (L) 03/02/2022   TSH 0.088 (L) 01/11/2022   TSH 1.89 11/11/2020   TSH 2.19 07/23/2020   TSH 32.56 (H) 04/28/2020

## 2022-07-15 NOTE — Assessment & Plan Note (Signed)
Per psychiatry They have been decreasing doses of her medications including Seroquel, citalopram, venlafaxine

## 2022-07-15 NOTE — Assessment & Plan Note (Signed)
On crestor daily, good compliance no SE or concerns at this time We will recheck her lipids when she returns in a few weeks to recheck her thyroid labs Last labs checked was not well-controlled Lab Results  Component Value Date   CHOL 245 (H) 01/11/2022   HDL 46 01/11/2022   LDLCALC 162 (H) 01/11/2022   TRIG 199 (H) 01/11/2022   CHOLHDL 5.3 (H) 01/11/2022

## 2022-07-15 NOTE — Assessment & Plan Note (Signed)
Per psychiatry 

## 2022-07-15 NOTE — Assessment & Plan Note (Signed)
Poorly controlled since decreasing Effexor dose but she would prefer to decrease med doses and she is adjusting meds with her psychiatrist Clonidine may be an option

## 2022-08-06 ENCOUNTER — Other Ambulatory Visit: Payer: Self-pay | Admitting: Family Medicine

## 2022-08-06 DIAGNOSIS — E038 Other specified hypothyroidism: Secondary | ICD-10-CM

## 2022-08-06 NOTE — Telephone Encounter (Signed)
Requested medications are due for refill today.  yes  Requested medications are on the active medications list.  yes  Last refill. 07/08/2022 #30 0 rf  Future visit scheduled.   yes  Notes to clinic.  Abnormal labs.    Requested Prescriptions  Pending Prescriptions Disp Refills   levothyroxine (SYNTHROID) 75 MCG tablet [Pharmacy Med Name: LEVOTHYROXINE 0.075MG  ( ) TABS] 30 tablet 0    Sig: TAKE 1 TABLET(75 MCG) BY MOUTH DAILY BEFORE BREAKFAST     Endocrinology:  Hypothyroid Agents Failed - 08/06/2022 11:24 AM      Failed - TSH in normal range and within 360 days    TSH  Date Value Ref Range Status  06/07/2022 0.344 (L) 0.450 - 4.500 uIU/mL Final         Passed - Valid encounter within last 12 months    Recent Outpatient Visits           3 weeks ago Other specified hypothyroidism   [facility] Danelle Berry, PA-C   6 months ago Hypercholesterolemia   [facility] Danelle Berry, PA-C   1 year ago Severe episode of recurrent major depressive disorder, without psychotic features [facility])   [place] [facility] Margarita Mail, DO   1 year ago Vitamin B12 deficiency   [place] Caro Laroche, DO   2 years ago Major depressive disorder in remission, unspecified whether recurrent [facility])   [place] [facility] Danelle Berry, PA-C       Future Appointments             In 4 months Danelle Berry, PA-C [facility] [place] [facility]

## 2022-08-25 LAB — COMPREHENSIVE METABOLIC PANEL
ALT: 20 IU/L (ref 0–32)
AST: 20 IU/L (ref 0–40)
Albumin/Globulin Ratio: 2.2
Albumin: 4.2 g/dL (ref 3.8–4.9)
Alkaline Phosphatase: 80 IU/L (ref 44–121)
BUN/Creatinine Ratio: 10 (ref 9–23)
BUN: 7 mg/dL (ref 6–24)
Bilirubin Total: 0.3 mg/dL (ref 0.0–1.2)
CO2: 27 mmol/L (ref 20–29)
Calcium: 9 mg/dL (ref 8.7–10.2)
Chloride: 103 mmol/L (ref 96–106)
Creatinine, Ser: 0.73 mg/dL (ref 0.57–1.00)
Globulin, Total: 1.9 g/dL (ref 1.5–4.5)
Glucose: 97 mg/dL (ref 70–99)
Potassium: 3.8 mmol/L (ref 3.5–5.2)
Sodium: 142 mmol/L (ref 134–144)
Total Protein: 6.1 g/dL (ref 6.0–8.5)
eGFR: 97 mL/min/{1.73_m2} (ref >59)

## 2022-08-25 LAB — TSH: TSH: 3.17 u[IU]/mL (ref 0.450–4.500)

## 2022-08-25 LAB — LIPID PANEL
Chol/HDL Ratio: 3.3 ratio (ref 0.0–4.4)
Cholesterol, Total: 148 mg/dL (ref 100–199)
HDL: 45 mg/dL (ref >39)
LDL Chol Calc (NIH): 69 mg/dL (ref 0–99)
Triglycerides: 203 mg/dL — ABNORMAL HIGH (ref 0–149)
VLDL Cholesterol Cal: 34 mg/dL (ref 5–40)

## 2022-08-25 LAB — T4, FREE: Free T4: 1.12 ng/dL (ref 0.82–1.77)

## 2022-08-26 ENCOUNTER — Encounter: Payer: Self-pay | Admitting: Family Medicine

## 2022-08-27 ENCOUNTER — Other Ambulatory Visit: Payer: Self-pay | Admitting: Family Medicine

## 2022-08-27 DIAGNOSIS — E038 Other specified hypothyroidism: Secondary | ICD-10-CM

## 2022-08-27 MED ORDER — LEVOTHYROXINE SODIUM 75 MCG PO TABS: ORAL_TABLET | ORAL | 0 refills | Status: DC

## 2022-09-12 ENCOUNTER — Other Ambulatory Visit: Payer: Self-pay | Admitting: Family Medicine

## 2022-09-12 DIAGNOSIS — G43909 Migraine, unspecified, not intractable, without status migrainosus: Secondary | ICD-10-CM

## 2022-09-12 DIAGNOSIS — E038 Other specified hypothyroidism: Secondary | ICD-10-CM

## 2022-09-12 DIAGNOSIS — R519 Headache, unspecified: Secondary | ICD-10-CM

## 2022-09-13 ENCOUNTER — Other Ambulatory Visit: Payer: Self-pay

## 2022-09-13 DIAGNOSIS — E038 Other specified hypothyroidism: Secondary | ICD-10-CM

## 2022-09-13 DIAGNOSIS — R519 Headache, unspecified: Secondary | ICD-10-CM

## 2022-09-13 DIAGNOSIS — G43909 Migraine, unspecified, not intractable, without status migrainosus: Secondary | ICD-10-CM

## 2022-09-13 MED ORDER — SUMATRIPTAN SUCCINATE 100 MG PO TABS
100.0000 mg | ORAL_TABLET | ORAL | 0 refills | Status: DC | PRN
Start: 2022-09-13 — End: 2023-01-26

## 2022-12-07 ENCOUNTER — Other Ambulatory Visit: Payer: Self-pay | Admitting: Family Medicine

## 2022-12-07 DIAGNOSIS — E7800 Pure hypercholesterolemia, unspecified: Secondary | ICD-10-CM

## 2022-12-08 ENCOUNTER — Other Ambulatory Visit: Payer: Self-pay

## 2022-12-08 DIAGNOSIS — E038 Other specified hypothyroidism: Secondary | ICD-10-CM

## 2022-12-08 MED ORDER — LEVOTHYROXINE SODIUM 75 MCG PO TABS
ORAL_TABLET | ORAL | 0 refills | Status: DC
Start: 2022-12-08 — End: 2023-03-15

## 2022-12-17 ENCOUNTER — Encounter: Payer: Managed Care, Other (non HMO) | Admitting: Family Medicine

## 2022-12-23 NOTE — Progress Notes (Signed)
Name: Charlotte Thompson   MRN: [id number]    DOB: 1965-08-22   Date:12/28/2022       Progress Note  Subjective  Chief Complaint  Chief Complaint  Patient presents with   Annual Exam    HPI  Patient presents for annual CPE.  Diet: salmon, tuna, pizza. Doesn't like greens. Drinks protein Ensure daily Exercise: walking frequently at job  Last Eye Exam: UTD Last Dental Exam: UTD, having to change insurance   Flowsheet Row Chronic Care Management from 12/01/2020 in [facility]  AUDIT-C Score 0      Depression: Phq 9 is  positive    12/28/2022    3:36 PM 07/13/2022    3:20 PM 01/11/2022    3:55 PM 04/27/2021    3:43 PM 12/01/2020    9:28 AM  Depression screen PHQ 2/9  Decreased Interest 2 0 0 3 3  Down, Depressed, Hopeless 2 0 1 3 3   PHQ - 2 Score 4 0 1 6 6   Altered sleeping  0 1 3 3   Tired, decreased energy  0 1 3 3   Change in appetite  0 1 2 3   Feeling bad or failure about yourself   0 0 3 2  Trouble concentrating  0 0 2 2  Moving slowly or fidgety/restless  0 0 2 1  Suicidal thoughts  0 0 2 0  PHQ-9 Score  0 4 23 20   Difficult doing work/chores  Not difficult at all Somewhat difficult Extremely dIfficult Somewhat difficult   Hypertension: BP Readings from Last 3 Encounters:  12/28/22 122/88  07/13/22 132/82  01/11/22 138/84   Obesity: Wt Readings from Last 3 Encounters:  12/28/22 157 lb 6.4 oz (71.4 kg)  07/13/22 155 lb 4.8 oz (70.4 kg)  01/11/22 153 lb 11.2 oz (69.7 kg)   BMI Readings from Last 3 Encounters:  12/28/22 26.19 kg/m  07/13/22 25.84 kg/m  01/11/22 25.58 kg/m     Vaccines:   HPV: n/a Tdap: 06/29/16  Shingrix: due Pneumonia: UTD  Flu: due today COVID-19: 3 doses  Hep C Screening: Complete STD testing and prevention (HIV/chl/gon/syphilis): HIV complete LMP:  partial hysterectomy 2010 Discussed importance of follow up if any post-menopausal bleeding: yes  Incontinence Symptoms: negative for symptoms   Breast  cancer:  - Last Mammogram: Due/ordered; last was in 2018, patient states she does not have time   Osteoporosis Prevention : Discussed high calcium and vitamin D supplementation, weight bearing exercises Bone density :not applicable   Cervical cancer screening: Cologuard 12/12/20 negative  Skin cancer: Discussed monitoring for atypical lesions  Colorectal cancer: cologuard 12/29/20 negative Lung cancer:  Low Dose CT Chest recommended if Age 73-80 years, 20 pack-year currently smoking OR have quit w/in 15years. Patient does qualify for screen   ECG: 07/25/18  Advanced Care Planning: A voluntary discussion about advance care planning including the explanation and discussion of advance directives.  Discussed health care proxy and Living will, and the patient was able to identify a health care proxy as [name] husband, daughter [name].  Patient does not have a living will and power of attorney of health care   Lipids: Lab Results  Component Value Date   CHOL 148 08/24/2022   CHOL 245 (H) 01/11/2022   CHOL 178 04/28/2020   Lab Results  Component Value Date   HDL 45 08/24/2022   HDL 46 01/11/2022   HDL 55 04/28/2020   Lab Results  Component Value Date  LDLCALC 69 08/24/2022   LDLCALC 162 (H) 01/11/2022   LDLCALC 100 (H) 04/28/2020   Lab Results  Component Value Date   TRIG 203 (H) 08/24/2022   TRIG 199 (H) 01/11/2022   TRIG 130 04/28/2020   Lab Results  Component Value Date   CHOLHDL 3.3 08/24/2022   CHOLHDL 5.3 (H) 01/11/2022   CHOLHDL 3.2 04/28/2020   No results found for: "LDLDIRECT"  Glucose: Glucose  Date Value Ref Range Status  08/24/2022 97 70 - 99 mg/dL Final  16/12/9602 93 70 - 99 mg/dL Final   Glucose, Bld  Date Value Ref Range Status  04/28/2020 93 65 - 99 mg/dL Final    Comment:    .            Fasting reference interval .   02/19/2019 101 (H) 65 - 99 mg/dL Final    Comment:    .            Fasting reference interval . For  someone without known diabetes, a glucose value between 100 and 125 mg/dL is consistent with prediabetes and should be confirmed with a follow-up test. .   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) 10/07/2016   Vitamin D deficiency 06/29/2016   Vitamin B12 deficiency 06/29/2016   Microalbuminuria 12/27/2015   Insomnia 12/27/2015   Anxiety and depression 12/05/2015   Hypothyroidism 06/16/2015   Tobacco abuse 06/16/2015   Hypercholesterolemia 06/16/2015   Hot flashes, menopausal 09/02/2014    Past Surgical History:  Procedure Laterality Date   COLONOSCOPY     PARTIAL HYSTERECTOMY  2010    Family History  Problem Relation Age of Onset   Autoimmune disease Father    Kidney failure Father    Heart disease Father    Heart attack Father    Hypertension Father    Hyperlipidemia Father    Clotting disorder Mother    Kidney disease Mother    Heart attack Mother    Thyroid disease Sister    Cancer Sister        breast   Breast cancer Sister 33   Thyroid disease Daughter    Diabetes Paternal Grandmother    Diabetes type II Paternal Grandmother    Hypertension Daughter    Hearing loss Maternal Grandmother    Cancer Maternal Grandmother        colon   Cancer Maternal Grandfather        prostate   Pneumonia Paternal Grandfather    Cancer Paternal Grandfather        bone    Social History   Socioeconomic History   Marital status: Married    Spouse name: [name]   Number of children: Not on file   Years of education: Not on file   Highest education level: Not on file  Occupational History   Not on file  Tobacco Use   Smoking status: Every Day    Types: Cigarettes    Start date: 03/24/2014   Smokeless tobacco: Never  Vaping Use   Vaping status: Never Used  Substance and Sexual Activity   Alcohol use:  No    Alcohol/week: 0.0 standard drinks of alcohol   Drug use: No   Sexual activity: Not Currently  Other Topics Concern   Not on file  Social History Narrative   Not on  file   Social Determinants of Health   Financial Resource Strain: High Risk (12/01/2020)   Overall Financial Resource Strain (CARDIA)    Difficulty of Paying Living Expenses: Hard  Food Insecurity: No Food Insecurity (12/01/2020)   Hunger Vital Sign    Worried About Running Out of Food in the Last Year: Never true    Ran Out of Food in the Last Year: Never true  Recent Concern: Food Insecurity - Food Insecurity Present (11/11/2020)   Hunger Vital Sign    Worried About Running Out of Food in the Last Year: Sometimes true    Ran Out of Food in the Last Year: Sometimes true  Transportation Needs: No Transportation Needs (12/01/2020)   PRAPARE - Administrator, Civil Service (Medical): No    Lack of Transportation (Non-Medical): No  Physical Activity: Insufficiently Active (12/01/2020)   Exercise Vital Sign    Days of Exercise per Week: 7 days    Minutes of Exercise per Session: 20 min  Stress: Stress Concern Present (12/01/2020)   [employer] of Occupational Health - Occupational Stress Questionnaire    Feeling of Stress : Very much  Social Connections: Moderately Integrated (12/01/2020)   Social Connection and Isolation Panel [NHANES]    Frequency of Communication with Friends and Family: More than three times a week    Frequency of Social Gatherings with Friends and Family: More than three times a week    Attends Religious Services: More than 4 times per year    Active Member of [employer] or Organizations: No    Attends Banker Meetings: Never    Marital Status: Married  Catering manager Violence: Not At Risk (11/11/2020)   Humiliation, Afraid, Rape, and Kick questionnaire    Fear of Current or Ex-Partner: No    Emotionally Abused: No    Physically Abused: No    Sexually Abused: No      Current Outpatient Medications:    citalopram (CELEXA) 40 MG tablet, Take 1 tablet (40 mg total) by mouth daily., Disp: , Rfl:    mirtazapine (REMERON) 15 MG tablet, Take 1 tablet (15 mg total) by mouth at bedtime., Disp: , Rfl:    nystatin (MYCOSTATIN/NYSTOP) powder, Apply 1 Application topically 3 (three) times daily., Disp: 15 g, Rfl: 0   levothyroxine (SYNTHROID) 75 MCG tablet, TAKE 1 TABLET(75 MCG) BY MOUTH DAILY BEFORE BREAKFAST, Disp: 90 tablet, Rfl: 0   Rimegepant Sulfate (NURTEC) 75 MG TBDP, Take 1 tablet (75 mg total) by mouth every other day., Disp: 45 tablet, Rfl: 1   rosuvastatin (CRESTOR) 20 MG tablet, TAKE 1 TABLET(20 MG) BY MOUTH DAILY, Disp: 90 tablet, Rfl: 0   SUMAtriptan (IMITREX) 100 MG tablet, Take 1 tablet (100 mg total) by mouth every 2 (two) hours as needed for migraine. May repeat in 2 hours if headache persists or recurs., Disp: 18 tablet, Rfl: 5   SUMAtriptan (IMITREX) 100 MG tablet, Take 1 tablet (100 mg total) by mouth every 2 (two) hours as needed for migraine. May repeat in 2 hours if headache persists or recurs, max 200 mg in 24 hours, Disp: 10 tablet, Rfl: 0  Allergies  Allergen Reactions   Aspirin Nausea And Vomiting   Codeine Hives     Review of Systems  All other systems reviewed and are negative.   Objective  Vitals:   12/28/22 1536  BP: 122/88  Pulse: 79  Resp: 18  Temp: 98 F (36.7 C)  SpO2: 98%  Weight: 157  lb 6.4 oz (71.4 kg)  Height: 5\' 5"  (1.651 m)    Body mass index is 26.19 kg/m.  Physical Exam Constitutional:      Appearance: Normal appearance.  HENT:     Head: Normocephalic and atraumatic.     Mouth/Throat:     Mouth: Mucous membranes are moist.     Pharynx: Oropharynx is clear.  Eyes:     Extraocular Movements: Extraocular movements intact.     Conjunctiva/sclera: Conjunctivae normal.     Pupils: Pupils are equal, round, and reactive to light.  Neck:     Comments: No thyromegaly  Cardiovascular:     Rate and  Rhythm: Normal rate and regular rhythm.  Pulmonary:     Effort: Pulmonary effort is normal.     Breath sounds: Normal breath sounds.  Musculoskeletal:     Cervical back: No tenderness.     Right lower leg: No edema.     Left lower leg: No edema.  Lymphadenopathy:     Cervical: No cervical adenopathy.  Skin:    General: Skin is warm and dry.     Comments: Yeast dermatitis under breasts  Neurological:     General: No focal deficit present.     Mental Status: She is alert. Mental status is at baseline.  Psychiatric:        Mood and Affect: Mood normal.        Behavior: Behavior normal.     No results found for this or any previous visit (from the past 2160 hour(s)).   Fall Risk:    12/28/2022    3:36 PM 07/13/2022    3:20 PM 01/11/2022    3:55 PM 04/27/2021    3:39 PM 11/11/2020    8:19 AM  Fall Risk   Falls in the past year? 0 0 0 0 0  Number falls in past yr: 0 0 0 0 0  Injury with Fall? 0 0 0 0 0  Risk for fall due to :  No Fall Risks No Fall Risks    Follow up  Falls prevention discussed;Education provided;Falls evaluation completed Falls evaluation completed;Education provided;Falls prevention discussed      Functional Status Survey: Is the patient deaf or have difficulty hearing?: No Does the patient have difficulty seeing, even when wearing glasses/contacts?: Yes Does the patient have difficulty concentrating, remembering, or making decisions?: No Does the patient have difficulty walking or climbing stairs?: Yes Does the patient have difficulty dressing or bathing?: No Does the patient have difficulty doing errands alone such as visiting a doctor's office or shopping?: No   Assessment & Plan  1. Annual physical exam/Encounter for screening mammogram for malignant neoplasm of breast/Screening for lung cancer: Physical exam completed, health maintenance reviewed, screening tests ordered. Reviewed labs from June.   - MM 3D SCREENING MAMMOGRAM BILATERAL BREAST;  Future - Ambulatory Referral Lung Cancer Screening [facility]  2. Yeast dermatitis: Nystatin powder prescribed.   - nystatin (MYCOSTATIN/NYSTOP) powder; Apply 1 Application topically 3 (three) times daily.  Dispense: 15 g; Refill: 0  3. Need for influenza vaccination: Flu vaccine administered today.   - Flu vaccine trivalent PF, 6mos and older(Flulaval,Afluria,Fluarix,Fluzone)   -USPSTF grade A and B recommendations reviewed with patient; age-appropriate recommendations, preventive care, screening tests, etc discussed and encouraged; healthy living encouraged; see AVS for patient education given to patient -Discussed importance of 150 minutes of physical activity weekly, eat two servings of fish weekly, eat one serving of tree nuts ( cashews, pistachios, pecans,  almonds.Marland Kitchen) every other day, eat 6 servings of fruit/vegetables daily and drink plenty of water and avoid sweet beverages.   -Reviewed Health Maintenance: Yes.

## 2022-12-28 ENCOUNTER — Encounter: Payer: Self-pay | Admitting: Internal Medicine

## 2022-12-28 ENCOUNTER — Ambulatory Visit (INDEPENDENT_AMBULATORY_CARE_PROVIDER_SITE_OTHER): Payer: Managed Care, Other (non HMO) | Admitting: Internal Medicine

## 2022-12-28 VITALS — BP 122/88 | HR 79 | Temp 98.0°F | Resp 18 | Ht 65.0 in | Wt 157.4 lb

## 2022-12-28 DIAGNOSIS — Z122 Encounter for screening for malignant neoplasm of respiratory organs: Secondary | ICD-10-CM

## 2022-12-28 DIAGNOSIS — Z23 Encounter for immunization: Secondary | ICD-10-CM | POA: Diagnosis not present

## 2022-12-28 DIAGNOSIS — Z0001 Encounter for general adult medical examination with abnormal findings: Secondary | ICD-10-CM | POA: Diagnosis not present

## 2022-12-28 DIAGNOSIS — B372 Candidiasis of skin and nail: Secondary | ICD-10-CM | POA: Diagnosis not present

## 2022-12-28 DIAGNOSIS — Z1231 Encounter for screening mammogram for malignant neoplasm of breast: Secondary | ICD-10-CM

## 2022-12-28 DIAGNOSIS — Z0000 Encounter for general adult medical examination without abnormal findings: Secondary | ICD-10-CM

## 2022-12-28 MED ORDER — NYSTATIN 100000 UNIT/GM EX POWD
1.0000 | Freq: Three times a day (TID) | CUTANEOUS | 0 refills | Status: DC
Start: 2022-12-28 — End: 2023-07-11

## 2023-01-25 ENCOUNTER — Other Ambulatory Visit: Payer: Self-pay | Admitting: Family Medicine

## 2023-01-25 DIAGNOSIS — G43909 Migraine, unspecified, not intractable, without status migrainosus: Secondary | ICD-10-CM

## 2023-01-25 DIAGNOSIS — R519 Headache, unspecified: Secondary | ICD-10-CM

## 2023-03-06 ENCOUNTER — Other Ambulatory Visit: Payer: Self-pay | Admitting: Family Medicine

## 2023-03-06 DIAGNOSIS — E7800 Pure hypercholesterolemia, unspecified: Secondary | ICD-10-CM

## 2023-03-10 ENCOUNTER — Other Ambulatory Visit: Payer: Self-pay | Admitting: Physician Assistant

## 2023-03-10 DIAGNOSIS — E038 Other specified hypothyroidism: Secondary | ICD-10-CM

## 2023-03-11 ENCOUNTER — Other Ambulatory Visit: Payer: Self-pay | Admitting: Family Medicine

## 2023-03-11 DIAGNOSIS — E038 Other specified hypothyroidism: Secondary | ICD-10-CM

## 2023-03-15 NOTE — Telephone Encounter (Signed)
 Requested Prescriptions  Pending Prescriptions Disp Refills   levothyroxine  (SYNTHROID ) 75 MCG tablet [Pharmacy Med Name: LEVOTHYROXINE  0.075MG  ( ) TABS] 90 tablet 0    Sig: TAKE 1 TABLET(75 MCG) BY MOUTH DAILY BEFORE BREAKFAST     Endocrinology:  Hypothyroid Agents Passed - 03/15/2023  1:51 PM      Passed - TSH in normal range and within 360 days    TSH  Date Value Ref Range Status  08/24/2022 3.170 0.450 - 4.500 uIU/mL Final         Passed - Valid encounter within last 12 months    Recent Outpatient Visits           2 months ago Annual physical exam   [facility] Bernardo Fend, DO   8 months ago Other specified hypothyroidism   [facility] Leavy Mole, PA-C   1 year ago Hypercholesterolemia   [facility] Leavy Mole, PA-C   1 year ago Severe episode of recurrent major depressive disorder, without psychotic features [facility])   [facility] Bernardo Fend, DO   2 years ago Vitamin B12 deficiency   [facility] Madelon Donald HERO, DO

## 2023-06-05 ENCOUNTER — Other Ambulatory Visit: Payer: Self-pay | Admitting: Internal Medicine

## 2023-06-05 DIAGNOSIS — E7800 Pure hypercholesterolemia, unspecified: Secondary | ICD-10-CM

## 2023-06-06 NOTE — Telephone Encounter (Signed)
 Requested Prescriptions  Pending Prescriptions Disp Refills   rosuvastatin (CRESTOR) 20 MG tablet [Pharmacy Med Name: ROSUVASTATIN 20MG  TABLETS] 90 tablet 0    Sig: TAKE 1 TABLET(20 MG) BY MOUTH DAILY     Cardiovascular:  Antilipid - Statins 2 Failed - 06/06/2023  5:34 PM      Failed - Lipid Panel in normal range within the last 12 months    Cholesterol, Total  Date Value Ref Range Status  08/24/2022 148 100 - 199 mg/dL Final   LDL Cholesterol (Calc)  Date Value Ref Range Status  04/28/2020 100 (H) mg/dL (calc) Final    Comment:    Reference range: <100 . Desirable range <100 mg/dL for primary prevention;   <70 mg/dL for patients with CHD or diabetic patients  with > or = 2 CHD risk factors. Marland Kitchen LDL-C is now calculated using the Martin-Hopkins  calculation, which is a validated novel method providing  better accuracy than the Friedewald equation in the  estimation of LDL-C.  Horald Pollen et al. Lenox Ahr. 1610;960(45): 2061-2068  (http://education.[facility].com/faq/FAQ164)    LDL Chol Calc (NIH)  Date Value Ref Range Status  08/24/2022 69 0 - 99 mg/dL Final   HDL  Date Value Ref Range Status  08/24/2022 45 >39 mg/dL Final   Triglycerides  Date Value Ref Range Status  08/24/2022 203 (H) 0 - 149 mg/dL Final         Passed - Cr in normal range and within 360 days    Creat  Date Value Ref Range Status  04/28/2020 0.82 0.50 - 1.05 mg/dL Final    Comment:    For patients >1 years of age, the reference limit for Creatinine is approximately 13% higher for people identified as African-American. .    Creatinine, Ser  Date Value Ref Range Status  08/24/2022 0.73 0.57 - 1.00 mg/dL Final   Creatinine, Urine  Date Value Ref Range Status  03/10/2016 198 20 - 320 mg/dL Final         Passed - Patient is not pregnant      Passed - Valid encounter within last 12 months    Recent Outpatient Visits           5 months ago Annual physical exam   [facility] Margarita Mail, DO   10 months ago Other specified hypothyroidism   [facility] Danelle Berry, PA-C   1 year ago Hypercholesterolemia   [facility] Danelle Berry, PA-C   2 years ago Severe episode of recurrent major depressive disorder, without psychotic features [facility])   [facility] Margarita Mail, DO   2 years ago Vitamin B12 deficiency   [facility] Caro Laroche, DO

## 2023-06-10 ENCOUNTER — Other Ambulatory Visit: Payer: Self-pay | Admitting: Physician Assistant

## 2023-06-10 DIAGNOSIS — E038 Other specified hypothyroidism: Secondary | ICD-10-CM

## 2023-06-13 NOTE — Telephone Encounter (Signed)
 Requested Prescriptions  Pending Prescriptions Disp Refills   levothyroxine (SYNTHROID) 75 MCG tablet [Pharmacy Med Name: LEVOTHYROXINE 0.075MG  ( ) TABS] 90 tablet 0    Sig: TAKE 1 TABLET(75 MCG) BY MOUTH DAILY BEFORE BREAKFAST     Endocrinology:  Hypothyroid Agents Failed - 06/13/2023 12:35 PM      Failed - Valid encounter within last 12 months    Recent Outpatient Visits   None            Passed - TSH in normal range and within 360 days    TSH  Date Value Ref Range Status  08/24/2022 3.170 0.450 - 4.500 uIU/mL Final

## 2023-07-11 ENCOUNTER — Ambulatory Visit: Payer: Self-pay

## 2023-07-11 ENCOUNTER — Encounter: Payer: Self-pay | Admitting: Internal Medicine

## 2023-07-11 ENCOUNTER — Other Ambulatory Visit: Payer: Self-pay

## 2023-07-11 ENCOUNTER — Ambulatory Visit: Admitting: Internal Medicine

## 2023-07-11 VITALS — BP 122/76 | HR 90 | Temp 98.1°F | Resp 16 | Ht 65.0 in | Wt 160.1 lb

## 2023-07-11 DIAGNOSIS — R110 Nausea: Secondary | ICD-10-CM

## 2023-07-11 DIAGNOSIS — R1013 Epigastric pain: Secondary | ICD-10-CM

## 2023-07-11 MED ORDER — ONDANSETRON 4 MG PO TBDP: 4.0000 mg | ORAL_TABLET | Freq: Three times a day (TID) | ORAL | 0 refills | Status: AC | PRN

## 2023-07-11 NOTE — Progress Notes (Signed)
 Acute Office Visit  Subjective:     Patient ID: Charlotte Thompson, female    DOB: 1965/12/06, 58 y.o.   MRN: [id number]  Chief Complaint  Patient presents with   Abdominal Pain    Nausea for 3 weeks    Abdominal Pain Associated symptoms include nausea. Pertinent negatives include no constipation, diarrhea, dysuria, fever, frequency, melena or vomiting.   Patient is in today for abdominal pain x 3 weeks.   Discussed the use of AI scribe software for clinical note transcription with the patient, who gave verbal consent to proceed.  History of Present Illness The patient, with a history of migraines, hyperlipidemia, and hypothyroidism, presents with a three-week history of constant, severe, and localized upper abdominal pain. The pain is described as a sharp, twisting sensation, which is worse after eating and in the morning, and is associated with nausea and bloating. The patient denies vomiting, changes in bowel movements, and fever. She has tried over-the-counter Prilosec without relief. The patient also reports feeling excessively thirsty and consuming large amounts of water, which occasionally leads to heartburn. The patient's appetite has decreased, but she denies weight loss. The patient's sleep is affected due to the pain.   Review of Systems  Constitutional:  Negative for chills and fever.  Gastrointestinal:  Positive for abdominal pain and nausea. Negative for blood in stool, constipation, diarrhea, heartburn, melena and vomiting.  Genitourinary:  Negative for dysuria, frequency and urgency.        Objective:    BP 122/76 (Cuff Size: Large)   Pulse 90   Temp 98.1 F (36.7 C) (Oral)   Resp 16   Ht 5\' 5"  (1.651 m)   Wt 160 lb 1.6 oz (72.6 kg)   SpO2 98%   BMI 26.64 kg/m  BP Readings from Last 3 Encounters:  07/11/23 122/76  12/28/22 122/88  07/13/22 132/82   Wt Readings from Last 3 Encounters:  07/11/23 160 lb 1.6 oz (72.6 kg)  12/28/22 157 lb 6.4 oz (71.4 kg)   07/13/22 155 lb 4.8 oz (70.4 kg)      Physical Exam Constitutional:      Appearance: Normal appearance. She is well-developed.  HENT:     Head: Normocephalic and atraumatic.  Eyes:     Conjunctiva/sclera: Conjunctivae normal.  Cardiovascular:     Rate and Rhythm: Normal rate and regular rhythm.  Pulmonary:     Effort: Pulmonary effort is normal.     Breath sounds: Normal breath sounds.  Abdominal:     General: There is no distension.     Palpations: Abdomen is soft. There is no mass.     Tenderness: There is abdominal tenderness. There is no guarding or rebound.     Hernia: No hernia is present.     Comments: Severe pain to palpation in epigastric region  Skin:    General: Skin is warm and dry.  Neurological:     General: No focal deficit present.     Mental Status: She is alert. Mental status is at baseline.  Psychiatric:        Mood and Affect: Mood normal.        Behavior: Behavior normal.     No results found for any visits on 07/11/23.      Assessment & Plan:   Assessment & Plan Abdominal pain, nausea, and bloating Persistent upper abdominal pain with nausea and bloating. Differential includes GERD, peptic ulcer disease, and pancreatitis. Prilosec ineffective. No NSAID use, appendicitis, or gallbladder  issues. - Order CT scan with contrast to evaluate abdominal structures. - Order lab tests including lipase and CBC, CMP. - Advise dietary modifications: avoid large meals, high-fat foods; recommend soft foods and liquids. - Prescribe anti-nausea medication. - Continue Prilosec. - Refer to gastroenterology if symptoms persist.  - CBC w/Diff/Platelet - COMPLETE METABOLIC PANEL WITHOUT GFR - Lipase - CT ABDOMEN PELVIS W CONTRAST; Future - ondansetron  (ZOFRAN -ODT) 4 MG disintegrating tablet; Take 1 tablet (4 mg total) by mouth every 8 (eight) hours as needed for nausea or vomiting.  Dispense: 20 tablet; Refill: 0      Return in about 1 week (around  07/18/2023).  Rockney Cid, DO

## 2023-07-11 NOTE — Telephone Encounter (Signed)
 Copied from CRM [id number]. Topic: Clinical - Red Word Triage >> Jul 11, 2023  8:38 AM Juleen Oakland F wrote: Red Word that prompted transfer to Nurse Triage: Patient having stomach pain 8/10 and nausea for 2-3 weeks, requested an appointment as soon as possible  Chief Complaint: abd pain & nausea Symptoms: 8/10 abd pain - middle abd, nausea Frequency: 2-3 weeks Pertinent Negatives: Patient denies fever, SOB, chest pain Disposition: [] ED /[] Urgent Care (no appt availability in office) / [x] Appointment(In office/virtual)/ []  [facility]/ [] Home Care/ [] Refused Recommended Disposition /[] [place] [facility]/ []  Follow-up with PCP Additional Notes: abd pain less intense if sit still. Pain increasing to intense pain when get up & moving.  Water is now giving pt heartburn  Reason for Disposition  Nausea lasts > 1 week  [1] MODERATE pain (e.g., interferes with normal activities) AND [2] pain comes and goes (cramps) AND [3] present > 24 hours  (Exception: Pain with Vomiting or Diarrhea - see that Guideline.)  Answer Assessment - Initial Assessment Questions 1. LOCATION: "Where does it hurt?"      Middle of abd - "gut" 2. RADIATION: "Does the pain shoot anywhere else?" (e.g., chest, back)     no 3. ONSET: "When did the pain begin?" (e.g., minutes, hours or days ago)      2-3 weeks 4. SUDDEN: "Gradual or sudden onset?"     gradual 5. PATTERN "Does the pain come and go, or is it constant?"    - If it comes and goes: "How long does it last?" "Do you have pain now?"     (Note: Comes and goes means the pain is intermittent. It goes away completely between bouts.)    - If constant: "Is it getting better, staying the same, or getting worse?"      (Note: Constant means the pain never goes away completely; most serious pain is constant and gets worse.)      constant 6. SEVERITY: "How bad is the pain?"  (e.g., Scale 1-10; mild, moderate, or severe)    - MILD (1-3): Doesn't interfere with normal  activities, abdomen soft and not tender to touch.     - MODERATE (4-7): Interferes with normal activities or awakens from sleep, abdomen tender to touch.     - SEVERE (8-10): Excruciating pain, doubled over, unable to do any normal activities.       severe 7. RECURRENT SYMPTOM: "Have you ever had this type of stomach pain before?" If Yes, ask: "When was the last time?" and "What happened that time?"      no 8. CAUSE: "What do you think is causing the stomach pain?"     unknown 9. RELIEVING/AGGRAVATING FACTORS: "What makes it better or worse?" (e.g., antacids, bending or twisting motion, bowel movement)     Standing or moving around increasing pain 10. OTHER SYMPTOMS: "Do you have any other symptoms?" (e.g., back pain, diarrhea, fever, urination pain, vomiting)       no 11. PREGNANCY: "Is there any chance you are pregnant?" "When was your last menstrual period?"       N/a  Answer Assessment - Initial Assessment Questions 1. NAUSEA SEVERITY: "How bad is the nausea?" (e.g., mild, moderate, severe; dehydration, weight loss)   - MILD: loss of appetite without change in eating habits   - MODERATE: decreased oral intake without significant weight loss, dehydration, or malnutrition   - SEVERE: inadequate caloric or fluid intake, significant weight loss, symptoms of dehydration     severe  2. ONSET: "When did the nausea begin?"     2-3 weeks 3. VOMITING: "Any vomiting?" If Yes, ask: "How many times today?"     no 4. RECURRENT SYMPTOM: "Have you had nausea before?" If Yes, ask: "When was the last time?" "What happened that time?"     no 5. CAUSE: "What do you think is causing the nausea?"     unknown 6. PREGNANCY: "Is there any chance you are pregnant?" (e.g., unprotected intercourse, missed birth control pill, broken condom)     N/a  Protocols used: Nausea-A-AH, Abdominal Pain - Female-A-AH

## 2023-07-11 NOTE — Addendum Note (Signed)
 Addended by: Dreden Rivere, Dori Garbe on: 07/11/2023 01:45 PM   Modules accepted: Orders

## 2023-07-14 ENCOUNTER — Encounter: Payer: Self-pay | Admitting: Internal Medicine

## 2023-07-14 LAB — COMPREHENSIVE METABOLIC PANEL WITH GFR
ALT: 17 IU/L (ref 0–32)
AST: 16 IU/L (ref 0–40)
Albumin: 4.4 g/dL (ref 3.8–4.9)
Alkaline Phosphatase: 79 IU/L (ref 44–121)
BUN/Creatinine Ratio: 13 (ref 9–23)
BUN: 9 mg/dL (ref 6–24)
CO2: 25 mmol/L (ref 20–29)
Calcium: 9.6 mg/dL (ref 8.7–10.2)
Chloride: 103 mmol/L (ref 96–106)
Creatinine, Ser: 0.71 mg/dL (ref 0.57–1.00)
Globulin, Total: 1.8 g/dL (ref 1.5–4.5)
Glucose: 111 mg/dL — ABNORMAL HIGH (ref 70–99)
Potassium: 4.6 mmol/L (ref 3.5–5.2)
Sodium: 142 mmol/L (ref 134–144)
Total Protein: 6.2 g/dL (ref 6.0–8.5)
eGFR: 100 mL/min/{1.73_m2} (ref >59)

## 2023-07-14 LAB — CBC WITH DIFFERENTIAL/PLATELET
Basophils Absolute: 0 10*3/uL (ref 0.0–0.2)
Basos: 1 %
EOS (ABSOLUTE): 0.1 10*3/uL (ref 0.0–0.4)
Eos: 1 %
Hematocrit: 44.6 % (ref 34.0–46.6)
Hemoglobin: 14.4 g/dL (ref 11.1–15.9)
Immature Grans (Abs): 0 10*3/uL (ref 0.0–0.1)
Immature Granulocytes: 0 %
Lymphocytes Absolute: 1.6 10*3/uL (ref 0.7–3.1)
Lymphs: 21 %
MCH: 31.5 pg (ref 26.6–33.0)
MCHC: 32.3 g/dL (ref 31.5–35.7)
MCV: 98 fL — ABNORMAL HIGH (ref 79–97)
Monocytes Absolute: 0.5 10*3/uL (ref 0.1–0.9)
Monocytes: 7 %
Neutrophils Absolute: 5.3 10*3/uL (ref 1.4–7.0)
Neutrophils: 70 %
Platelets: 256 10*3/uL (ref 150–450)
RBC: 4.57 x10E6/uL (ref 3.77–5.28)
RDW: 12.4 % (ref 11.7–15.4)
WBC: 7.6 10*3/uL (ref 3.4–10.8)

## 2023-07-14 LAB — LIPASE: Lipase: 17 U/L (ref 14–72)

## 2023-07-18 ENCOUNTER — Encounter: Payer: Self-pay | Admitting: Internal Medicine

## 2023-07-19 ENCOUNTER — Encounter: Payer: Self-pay | Admitting: Internal Medicine

## 2023-07-21 ENCOUNTER — Ambulatory Visit: Admitting: Internal Medicine

## 2023-09-12 ENCOUNTER — Other Ambulatory Visit: Payer: Self-pay | Admitting: Internal Medicine

## 2023-09-12 DIAGNOSIS — E038 Other specified hypothyroidism: Secondary | ICD-10-CM

## 2023-09-14 NOTE — Telephone Encounter (Signed)
 Requested medication (s) are due for refill today - yes  Requested medication (s) are on the active medication list -yes  Future visit scheduled -yes  Last refill: 06/13/23 #90  Notes to clinic: fails lab protocol- over 1 year- 08/24/22  Requested Prescriptions  Pending Prescriptions Disp Refills   levothyroxine  (SYNTHROID ) 75 MCG tablet [Pharmacy Med Name: LEVOTHYROXINE  0.075MG  ( ) TABS] 90 tablet 0    Sig: TAKE 1 TABLET(75 MCG) BY MOUTH DAILY BEFORE BREAKFAST     Endocrinology:  Hypothyroid Agents Failed - 09/14/2023  1:29 PM      Failed - TSH in normal range and within 360 days    TSH  Date Value Ref Range Status  08/24/2022 3.170 0.450 - 4.500 uIU/mL Final         Failed - Valid encounter within last 12 months    Recent Outpatient Visits           2 months ago Epigastric pain   [facility] Bernardo Fend, DO                 Requested Prescriptions  Pending Prescriptions Disp Refills   levothyroxine  (SYNTHROID ) 75 MCG tablet [Pharmacy Med Name: LEVOTHYROXINE  0.075MG  ( ) TABS] 90 tablet 0    Sig: TAKE 1 TABLET(75 MCG) BY MOUTH DAILY BEFORE BREAKFAST     Endocrinology:  Hypothyroid Agents Failed - 09/14/2023  1:29 PM      Failed - TSH in normal range and within 360 days    TSH  Date Value Ref Range Status  08/24/2022 3.170 0.450 - 4.500 uIU/mL Final         Failed - Valid encounter within last 12 months    Recent Outpatient Visits           2 months ago Epigastric pain   [facility] Bernardo Fend, [place]

## 2023-11-30 ENCOUNTER — Other Ambulatory Visit: Payer: Self-pay | Admitting: Internal Medicine

## 2023-11-30 DIAGNOSIS — E7800 Pure hypercholesterolemia, unspecified: Secondary | ICD-10-CM

## 2023-12-01 NOTE — Telephone Encounter (Signed)
 Requested medications are due for refill today.  yes  Requested medications are on the active medications list.  yes  Last refill. 06/06/2023 #90 0 rf  Future visit scheduled.   yes  Notes to clinic.  Labs are expired.    Requested Prescriptions  Pending Prescriptions Disp Refills   rosuvastatin  (CRESTOR ) 20 MG tablet [Pharmacy Med Name: ROSUVASTATIN  20MG  TABLETS] 90 tablet 0    Sig: TAKE 1 TABLET(20 MG) BY MOUTH DAILY     Cardiovascular:  Antilipid - Statins 2 Failed - 12/01/2023  1:41 PM      Failed - Valid encounter within last 12 months    Recent Outpatient Visits           4 months ago Epigastric pain   [place] [facility] Bernardo Fend, DO              Failed - Lipid Panel in normal range within the last 12 months    Cholesterol, Total  Date Value Ref Range Status  08/24/2022 148 100 - 199 mg/dL Final   LDL Cholesterol (Calc)  Date Value Ref Range Status  04/28/2020 100 (H) mg/dL (calc) Final    Comment:    Reference range: <100 . Desirable range <100 mg/dL for primary prevention;   <70 mg/dL for patients with CHD or diabetic patients  with > or = 2 CHD risk factors. SABRA LDL-C is now calculated using the Martin-Hopkins  calculation, which is a validated novel method providing  better accuracy than the Friedewald equation in the  estimation of LDL-C.  Gladis APPLETHWAITE et al. SANDREA. 7986;689(80): 2061-2068  (http://education.QuestDiagnostics.com/faq/FAQ164)    LDL Chol Calc (NIH)  Date Value Ref Range Status  08/24/2022 69 0 - 99 mg/dL Final   HDL  Date Value Ref Range Status  08/24/2022 45 >39 mg/dL Final   Triglycerides  Date Value Ref Range Status  08/24/2022 203 (H) 0 - 149 mg/dL Final         Passed - Cr in normal range and within 360 days    Creat  Date Value Ref Range Status  04/28/2020 0.82 0.50 - 1.05 mg/dL Final    Comment:    For patients >49 years of age, the reference limit for Creatinine is approximately 13%  higher for people identified as African-American. .    Creatinine, Ser  Date Value Ref Range Status  07/11/2023 0.71 0.57 - 1.00 mg/dL Final   Creatinine, Urine  Date Value Ref Range Status  03/10/2016 198 20 - 320 mg/dL Final         Passed - Patient is not pregnant

## 2023-12-17 ENCOUNTER — Other Ambulatory Visit: Payer: Self-pay | Admitting: Internal Medicine

## 2023-12-17 DIAGNOSIS — E038 Other specified hypothyroidism: Secondary | ICD-10-CM

## 2023-12-19 NOTE — Telephone Encounter (Signed)
 Requested medications are due for refill today.  yes  Requested medications are on the active medications list.  yes  Last refill. 09/15/2023 #90 0 rf  Future visit scheduled.   yes  Notes to clinic.  Labs are expired    Requested Prescriptions  Pending Prescriptions Disp Refills   levothyroxine  (SYNTHROID ) 75 MCG tablet [Pharmacy Med Name: LEVOTHYROXINE  0.075MG  ( ) TABS] 90 tablet 0    Sig: TAKE 1 TABLET(75 MCG) BY MOUTH DAILY BEFORE BREAKFAST     Endocrinology:  Hypothyroid Agents Failed - 12/19/2023  4:08 PM      Failed - TSH in normal range and within 360 days    TSH  Date Value Ref Range Status  08/24/2022 3.170 0.450 - 4.500 uIU/mL Final         Failed - Valid encounter within last 12 months    Recent Outpatient Visits           5 months ago Epigastric pain   [facility] Bernardo Fend, [place]

## 2023-12-29 ENCOUNTER — Encounter: Payer: Self-pay | Admitting: Internal Medicine

## 2023-12-29 ENCOUNTER — Ambulatory Visit (INDEPENDENT_AMBULATORY_CARE_PROVIDER_SITE_OTHER): Admitting: Internal Medicine

## 2023-12-29 VITALS — BP 118/72 | HR 85 | Resp 16 | Ht 65.0 in | Wt 146.0 lb

## 2023-12-29 DIAGNOSIS — E038 Other specified hypothyroidism: Secondary | ICD-10-CM | POA: Diagnosis not present

## 2023-12-29 DIAGNOSIS — Z0001 Encounter for general adult medical examination with abnormal findings: Secondary | ICD-10-CM

## 2023-12-29 DIAGNOSIS — Z23 Encounter for immunization: Secondary | ICD-10-CM | POA: Diagnosis not present

## 2023-12-29 DIAGNOSIS — Z13220 Encounter for screening for lipoid disorders: Secondary | ICD-10-CM

## 2023-12-29 DIAGNOSIS — Z1211 Encounter for screening for malignant neoplasm of colon: Secondary | ICD-10-CM

## 2023-12-29 DIAGNOSIS — E7800 Pure hypercholesterolemia, unspecified: Secondary | ICD-10-CM | POA: Diagnosis not present

## 2023-12-29 DIAGNOSIS — Z122 Encounter for screening for malignant neoplasm of respiratory organs: Secondary | ICD-10-CM

## 2023-12-29 DIAGNOSIS — Z0000 Encounter for general adult medical examination without abnormal findings: Secondary | ICD-10-CM

## 2023-12-29 MED ORDER — ROSUVASTATIN CALCIUM 20 MG PO TABS: 20.0000 mg | ORAL_TABLET | Freq: Every day | ORAL | 3 refills | Status: AC

## 2023-12-29 MED ORDER — LEVOTHYROXINE SODIUM 75 MCG PO TABS
ORAL_TABLET | ORAL | 3 refills | Status: AC
Start: 2023-12-29 — End: ?

## 2023-12-29 NOTE — Patient Instructions (Addendum)
 It was great seeing you today!  Plan discussed at today's visit: -Blood work ordered today, results will be uploaded to MyChart. Please be fasting for labs can come any weekday 8-11:30 and 1:30-3:30.  -Flu vaccine today -Cologuard ordered -Consider mammogram -Referral for lung cancer screening placed, they will call to schedule this.  -Medications refilled.   Follow up in: 1 year or sooner as needed  Take care and let us  know if you have any questions or concerns prior to your next visit.  Dr. Bernardo  Recombinant Zoster (Shingles) Vaccine: What You Need to Know Many vaccine information statements are available in Spanish and other languages. See PromoAge.com.br. 1. Why get vaccinated? Recombinant zoster (shingles) vaccine can prevent shingles. Shingles (also called herpes zoster, or just zoster) is a painful skin rash, usually with blisters. In addition to the rash, shingles can cause fever, headache, chills, or upset stomach. Rarely, shingles can lead to complications such as pneumonia, hearing problems, blindness, brain inflammation (encephalitis), or death. The risk of shingles increases with age. The most common complication of shingles is long-term nerve pain called postherpetic neuralgia (PHN). PHN occurs in the areas where the shingles rash was and can last for months or years after the rash goes away. The pain from PHN can be severe and debilitating. The risk of PHN increases with age. An older adult with shingles is more likely to develop PHN and have longer lasting and more severe pain than a younger person. People with weakened immune systems also have a higher risk of getting shingles and complications from the disease. Shingles is caused by varicella-zoster virus, the same virus that causes chickenpox. After you have chickenpox, the virus stays in your body and can cause shingles later in life. Shingles cannot be passed from one person to another, but the virus that causes  shingles can spread and cause chickenpox in someone who has never had chickenpox or has never received chickenpox vaccine. 2. Recombinant shingles vaccine Recombinant shingles vaccine provides strong protection against shingles. By preventing shingles, recombinant shingles vaccine also protects against PHN and other complications. Recombinant shingles vaccine is recommended for: Adults 50 years and older Adults 19 years and older who have a weakened immune system because of disease or treatments Shingles vaccine is given as a two-dose series. For most people, the second dose should be given 2 to 6 months after the first dose. Some people who have or will have a weakened immune system can get the second dose 1 to 2 months after the first dose. Ask your health care provider for guidance. People who have had shingles in the past and people who have received varicella (chickenpox) vaccine are recommended to get recombinant shingles vaccine. The vaccine is also recommended for people who have already gotten another type of shingles vaccine, the live shingles vaccine. There is no live virus in recombinant shingles vaccine. Shingles vaccine may be given at the same time as other vaccines. 3. Talk with your health care provider Tell your vaccination provider if the person getting the vaccine: Has had an allergic reaction after a previous dose of recombinant shingles vaccine, or has any severe, life-threatening allergies Is currently experiencing an episode of shingles Is pregnant In some cases, your health care provider may decide to postpone shingles vaccination until a future visit. People with minor illnesses, such as a cold, may be vaccinated. People who are moderately or severely ill should usually wait until they recover before getting recombinant shingles vaccine. Your health care  provider can give you more information. 4. Risks of a vaccine reaction A sore arm with mild or moderate pain is very  common after recombinant shingles vaccine. Redness and swelling can also happen at the site of the injection. Tiredness, muscle pain, headache, shivering, fever, stomach pain, and nausea are common after recombinant shingles vaccine. These side effects may temporarily prevent a vaccinated person from doing regular activities. Symptoms usually go away on their own in 2 to 3 days. You should still get the second dose of recombinant shingles vaccine even if you had one of these reactions after the first dose. Guillain-Barr syndrome (GBS), a serious nervous system disorder, has been reported very rarely after recombinant zoster vaccine. People sometimes faint after medical procedures, including vaccination. Tell your provider if you feel dizzy or have vision changes or ringing in the ears. As with any medicine, there is a very remote chance of a vaccine causing a severe allergic reaction, other serious injury, or death. 5. What if there is a serious problem? An allergic reaction could occur after the vaccinated person leaves the clinic. If you see signs of a severe allergic reaction (hives, swelling of the face and throat, difficulty breathing, a fast heartbeat, dizziness, or weakness), call 9-1-1 and get the person to the nearest hospital. For other signs that concern you, call your health care provider. Adverse reactions should be reported to the Vaccine Adverse Event Reporting System (VAERS). Your health care provider will usually file this report, or you can do it yourself. Visit the VAERS website at www.vaers.LAgents.no or call 702-780-0840. VAERS is only for reporting reactions, and VAERS staff members do not give medical advice. 6. How can I learn more? Ask your health care provider. Call your local or state health department. Visit the website of the Food and Drug Administration (FDA) for vaccine package inserts and additional information at GoldCloset.com.ee. Contact the  Centers for Disease Control and Prevention (CDC): Call 802-045-2591 (1-800-CDC-INFO) or Visit CDC's website at PicCapture.uy. Source: CDC Vaccine Information Statement Recombinant Zoster Vaccine (04/18/2020) This same material is available at FootballExhibition.com.br for no charge. This information is not intended to replace advice given to you by your health care provider. Make sure you discuss any questions you have with your health care provider. Document Revised: 06/16/2022 Document Reviewed: 03/17/2022 Elsevier Patient Education  2024 [employer].

## 2023-12-29 NOTE — Progress Notes (Signed)
 Name: Charlotte Thompson   MRN: [id number]    DOB: 01/16/1966   Date:12/29/2023       Progress Note  Subjective  Chief Complaint  Chief Complaint  Patient presents with   Annual Exam    HPI  Patient presents for annual CPE.  Discussed the use of AI scribe software for clinical note transcription with the patient, who gave verbal consent to proceed.  History of Present Illness Charlotte Thompson is a 58 year old female who presents for an annual physical exam.  She has not taken levothyroxine  0.75 mcg for the past two weeks due to a refill issue. She is currently taking rosuvastatin  for cholesterol management and uses migraine medication as needed. Her mental health medications, including citalopram , Cymbalta, and Seroquel , are managed by another provider.  She prefers Cologuard testing for colorectal cancer screening, with a negative result in 2022. Her last Pap smear in 2022 was negative for abnormal cells and HPV. She has not had a mammogram in several years.  Her family history includes her sister's breast and lung cancer. She maintains a well-rounded diet and regular physical activity, walking nine hours a day at work and performing chair exercises at home. She vapes but quit smoking three years ago after a 20-year history of smoking.  She is up to date with most vaccinations, including flu, pneumonia, tetanus, and COVID-19, but has not received the shingles vaccine. She has never had shingles but had chickenpox as a child. She has had no vaginal bleeding since 2010 and has no current concerns about STD screening.   Diet: well balanced, cooks at home Exercise: very active at work, chair exercises   Last Eye Exam: completed Last Dental Exam: encouraged to complete  Flowsheet Row Office Visit from 12/29/2023 in [facility]  AUDIT-C Score 0   Depression: Phq 9 is  positive    12/29/2023    3:15 PM 12/28/2022    3:36 PM 07/13/2022    3:20 PM  01/11/2022    3:55 PM 04/27/2021    3:43 PM  Depression screen PHQ 2/9  Decreased Interest 0 2 0 0 3  Down, Depressed, Hopeless 0 2 0 1 3  PHQ - 2 Score 0 4 0 1 6  Altered sleeping 0  0 1 3  Tired, decreased energy 0  0 1 3  Change in appetite 0  0 1 2  Feeling bad or failure about yourself  0  0 0 3  Trouble concentrating 0  0 0 2  Moving slowly or fidgety/restless 0  0 0 2  Suicidal thoughts 0  0 0 2  PHQ-9 Score 0  0 4 23  Difficult doing work/chores   Not difficult at all Somewhat difficult Extremely dIfficult   Hypertension: BP Readings from Last 3 Encounters:  12/29/23 118/72  07/11/23 122/76  12/28/22 122/88   Obesity: Wt Readings from Last 3 Encounters:  12/29/23 146 lb (66.2 kg)  07/11/23 160 lb 1.6 oz (72.6 kg)  12/28/22 157 lb 6.4 oz (71.4 kg)   BMI Readings from Last 3 Encounters:  12/29/23 24.30 kg/m  07/11/23 26.64 kg/m  12/28/22 26.19 kg/m     Vaccines: Flu and Shingles reviewed with the patient.   Hep C Screening: completed STD testing and prevention (HIV/chl/gon/syphilis): no concerns Menstrual History/LMP/Abnormal Bleeding: March 2010 LMP Discussed importance of follow up if any post-menopausal bleeding: yes   Breast cancer:  - Last Mammogram: Continues to decline, discussed risk  of infrequent screening  Osteoporosis Prevention : Discussed high calcium  and vitamin D  supplementation, weight bearing exercises Bone density :no   Cervical cancer screening: up-to-date 8/22 negative, repeat in 5 years.  Skin cancer: Discussed monitoring for atypical lesions  Colorectal cancer: Cologuard due   Lung cancer:  Low Dose CT Chest recommended if Age 76-80 years, 20 pack-year currently smoking OR have quit w/in 15years. 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[facility]) 10/07/2016   Vitamin D   deficiency 06/29/2016   Vitamin B12 deficiency 06/29/2016   Microalbuminuria 12/27/2015   Insomnia 12/27/2015   Anxiety and depression 12/05/2015   Hypothyroidism 06/16/2015   Tobacco abuse 06/16/2015   Hypercholesterolemia 06/16/2015   Hot flashes, menopausal 09/02/2014    Past Surgical History:  Procedure Laterality Date   COLONOSCOPY     PARTIAL HYSTERECTOMY  2010    Family History  Problem Relation Age of Onset   Autoimmune disease Father    Kidney failure Father    Heart disease Father    Heart attack Father    Hypertension Father    Hyperlipidemia Father    Clotting disorder Mother    Kidney disease Mother    Heart attack Mother    Thyroid disease Sister    Cancer Sister        breast   Breast cancer Sister 14   Thyroid disease Daughter    Diabetes Paternal Grandmother    Diabetes type II Paternal Grandmother    Hypertension Daughter    Hearing loss Maternal Grandmother    Cancer Maternal Grandmother        colon   Cancer Maternal Grandfather        prostate   Pneumonia Paternal Grandfather    Cancer Paternal Grandfather        bone    Social History   Socioeconomic History   Marital status: Married    Spouse name: Jackee   Number of children: Not on file   Years of education: Not on file   Highest education level: Not on file  Occupational History   Not on file  Tobacco Use   Smoking status: Every Day    Types: Cigarettes    Start date: 03/24/2014   Smokeless tobacco: Never  Vaping Use   Vaping status: Never Used  Substance and Sexual Activity   Alcohol use: No    Alcohol/week: 0.0 standard drinks of alcohol   Drug use: No   Sexual activity: Not Currently  Other Topics Concern   Not on file  Social History Narrative   Not on file   Social Drivers of Health   Financial Resource Strain: Low Risk  (12/29/2023)   Overall Financial Resource Strain (CARDIA)    Difficulty of Paying Living Expenses: Not hard at all  Food Insecurity: No Food  Insecurity (12/29/2023)   Hunger Vital Sign    Worried About Radiation protection practitioner of Food in the Last Year: Never true    Ran Out of Food in the Last Year: Never true  Transportation Needs: No Transportation Needs (12/29/2023)   PRAPARE - Administrator, Civil Service (Medical): No    Lack of Transportation (Non-Medical): No  Physical Activity: Sufficiently Active (12/29/2023)   Exercise Vital Sign    Days of Exercise per Week: 5 days    Minutes of Exercise per  Session: 60 min  Stress: No Stress Concern Present (12/29/2023)   [employer] of Occupational Health - Occupational Stress Questionnaire    Feeling of Stress: Not at all  Social Connections: Moderately Integrated (12/29/2023)   Social Connection and Isolation Panel    Frequency of Communication with Friends and Family: More than three times a week    Frequency of Social Gatherings with Friends and Family: More than three times a week    Attends Religious Services: More than 4 times per year    Active Member of [employer] or Organizations: No    Attends Banker Meetings: Never    Marital Status: Married  Catering manager Violence: Not At Risk (12/29/2023)   Humiliation, Afraid, Rape, and Kick questionnaire    Fear of Current or Ex-Partner: No    Emotionally Abused: No    Physically Abused: No    Sexually Abused: No     Current Outpatient Medications:    citalopram  (CELEXA ) 40 MG tablet, Take 1 tablet (40 mg total) by mouth daily., Disp: , Rfl:    DULoxetine (CYMBALTA) 60 MG capsule, Take 60 mg by mouth every morning., Disp: , Rfl:    ondansetron  (ZOFRAN -ODT) 4 MG disintegrating tablet, Take 1 tablet (4 mg total) by mouth every 8 (eight) hours as needed for nausea or vomiting., Disp: 20 tablet, Rfl: 0   QUEtiapine  (SEROQUEL ) 25 MG tablet, Take 75 mg by mouth at bedtime., Disp: , Rfl:    SUMAtriptan  (IMITREX ) 100 MG tablet, TAKE 1 TABLET BY MOUTH AS NEEDED FOR MIGRAINE, MAY REPEAT IN 2 HOURS IF HEADACHE  PERSISTS OR RECURS, MAX 200 MG IN 24 HOURS, Disp: 10 tablet, Rfl: 0   levothyroxine  (SYNTHROID ) 75 MCG tablet, TAKE 1 TABLET(75 MCG) BY MOUTH DAILY BEFORE BREAKFAST, Disp: 90 tablet, Rfl: 3   rosuvastatin  (CRESTOR ) 20 MG tablet, Take 1 tablet (20 mg total) by mouth daily., Disp: 90 tablet, Rfl: 3  Allergies  Allergen Reactions   Aspirin Nausea And Vomiting   Codeine Hives     Review of Systems  All other systems reviewed and are negative.    Objective  Vitals:   12/29/23 1513  BP: 118/72  Pulse: 85  Resp: 16  SpO2: 95%  Weight: 146 lb (66.2 kg)  Height: 5' 5 (1.651 m)    Body mass index is 24.3 kg/m.  Physical Exam Constitutional:      Appearance: Normal appearance.  HENT:     Head: Normocephalic and atraumatic.     Mouth/Throat:     Mouth: Mucous membranes are moist.     Pharynx: Oropharynx is clear.  Eyes:     Extraocular Movements: Extraocular movements intact.     Conjunctiva/sclera: Conjunctivae normal.     Pupils: Pupils are equal, round, and reactive to light.  Neck:     Comments: No thyromegaly Cardiovascular:     Rate and Rhythm: Normal rate and regular rhythm.  Pulmonary:     Effort: Pulmonary effort is normal.     Breath sounds: Normal breath sounds.  Musculoskeletal:     Cervical back: No tenderness.     Right lower leg: No edema.     Left lower leg: No edema.  Lymphadenopathy:     Cervical: No cervical adenopathy.  Skin:    General: Skin is warm and dry.  Neurological:     General: No focal deficit present.     Mental Status: She is alert. Mental status is at baseline.  Psychiatric:  Mood and Affect: Mood normal.        Behavior: Behavior normal.     Last CBC Lab Results  Component Value Date   WBC 7.6 07/11/2023   HGB 14.4 07/11/2023   HCT 44.6 07/11/2023   MCV 98 (H) 07/11/2023   MCH 31.5 07/11/2023   RDW 12.4 07/11/2023   PLT 256 07/11/2023   Last metabolic panel Lab Results  Component Value Date   GLUCOSE 111 (H)  07/11/2023   NA 142 07/11/2023   K 4.6 07/11/2023   CL 103 07/11/2023   CO2 25 07/11/2023   BUN 9 07/11/2023   CREATININE 0.71 07/11/2023   EGFR 100 07/11/2023   CALCIUM  9.6 07/11/2023   PROT 6.2 07/11/2023   ALBUMIN 4.4 07/11/2023   LABGLOB 1.8 07/11/2023   AGRATIO 2.2 08/24/2022   BILITOT <0.2 07/11/2023   ALKPHOS 79 07/11/2023   AST 16 07/11/2023   ALT 17 07/11/2023   ANIONGAP 9 07/24/2018   Last lipids Lab Results  Component Value Date   CHOL 148 08/24/2022   HDL 45 08/24/2022   LDLCALC 69 08/24/2022   TRIG 203 (H) 08/24/2022   CHOLHDL 3.3 08/24/2022   Last hemoglobin A1c Lab Results  Component Value Date   HGBA1C 5.6 11/11/2020   Last thyroid functions Lab Results  Component Value Date   TSH 3.170 08/24/2022   Last vitamin D  Lab Results  Component Value Date   VD25OH 36 07/19/2018   Last vitamin B12 and Folate Lab Results  Component Value Date   VITAMINB12 227 11/11/2020      Assessment & Plan  Assessment & Plan Adult Wellness Visit Routine wellness visit with no STD concerns. Vitals normal. Menopause since 2010. - Perform routine adult wellness visit. - Discuss diet and exercise habits.  Hypothyroidism Lapse in levothyroxine  for two weeks due to refill issues. - Refill levothyroxine  0.75 mcg and send to [facility]. - Postpone thyroid labs until six weeks after resuming medication. - Instruct to return for labs in six weeks while fasting.  Hyperlipidemia Managed with rosuvastatin , no issues reported. - Refill rosuvastatin  and send to [facility].  Weight Management Interest in weight loss, discussion deferred due to insurance. - Schedule separate visit for weight loss consultation. - Verify insurance coverage for weight loss medications.  General Health Maintenance Mammogram declined. Cologuard due. Pap smear negative in 2022. Lung cancer screening discussed. Flu and shingles vaccines addressed. Dental care needs reestablishment. -  Administer flu vaccine. - Reorder Cologuard for colon cancer screening. - Refer for lung cancer screening with low-dose CT scan. - Provide information on shingles vaccine for pharmacy administration. - Encourage reestablishment of dental care.  - CBC w/Diff/Platelet - Comprehensive Metabolic Panel (CMET) - Lipid Profile - TSH - levothyroxine  (SYNTHROID ) 75 MCG tablet; TAKE 1 TABLET(75 MCG) BY MOUTH DAILY BEFORE BREAKFAST  Dispense: 90 tablet; Refill: 3 - rosuvastatin  (CRESTOR ) 20 MG tablet; Take 1 tablet (20 mg total) by mouth daily.  Dispense: 90 tablet; Refill: 3 - Cologuard - Ambulatory Referral Lung Cancer Screening [facility] - Flu vaccine trivalent PF, 6mos and older(Flulaval,Afluria,Fluarix,Fluzone)   -USPSTF grade A and B recommendations reviewed with patient; age-appropriate recommendations, preventive care, screening tests, etc discussed and encouraged; healthy living encouraged; see AVS for patient education given to patient -Discussed importance of 150 minutes of physical activity weekly, eat two servings of fish weekly, eat one serving of tree nuts ( cashews, pistachios, pecans, almonds.SABRA) every other day, eat 6 servings of fruit/vegetables daily and drink plenty of  water and avoid sweet beverages.   -Reviewed Health Maintenance: Yes.

## 2024-02-05 LAB — COLOGUARD: COLOGUARD: NEGATIVE

## 2024-02-06 ENCOUNTER — Ambulatory Visit: Payer: Self-pay | Admitting: Internal Medicine

## 2024-02-14 ENCOUNTER — Other Ambulatory Visit: Payer: Self-pay

## 2024-02-14 DIAGNOSIS — Z13220 Encounter for screening for lipoid disorders: Secondary | ICD-10-CM

## 2024-02-14 DIAGNOSIS — Z0000 Encounter for general adult medical examination without abnormal findings: Secondary | ICD-10-CM

## 2024-02-14 DIAGNOSIS — E038 Other specified hypothyroidism: Secondary | ICD-10-CM

## 2024-02-14 DIAGNOSIS — E7800 Pure hypercholesterolemia, unspecified: Secondary | ICD-10-CM

## 2024-02-15 ENCOUNTER — Ambulatory Visit: Payer: Self-pay | Admitting: Internal Medicine

## 2024-02-15 LAB — CBC WITH DIFFERENTIAL/PLATELET
Basophils Absolute: 0.1 x10E3/uL (ref 0.0–0.2)
Basos: 1 %
EOS (ABSOLUTE): 0.1 x10E3/uL (ref 0.0–0.4)
Eos: 1 %
Hematocrit: 40.9 % (ref 34.0–46.6)
Hemoglobin: 13.7 g/dL (ref 11.1–15.9)
Immature Grans (Abs): 0 x10E3/uL (ref 0.0–0.1)
Immature Granulocytes: 0 %
Lymphocytes Absolute: 1.6 x10E3/uL (ref 0.7–3.1)
Lymphs: 21 %
MCH: 32 pg (ref 26.6–33.0)
MCHC: 33.5 g/dL (ref 31.5–35.7)
MCV: 96 fL (ref 79–97)
Monocytes Absolute: 0.7 x10E3/uL (ref 0.1–0.9)
Monocytes: 9 %
Neutrophils Absolute: 5.1 x10E3/uL (ref 1.4–7.0)
Neutrophils: 68 %
Platelets: 250 x10E3/uL (ref 150–450)
RBC: 4.28 x10E6/uL (ref 3.77–5.28)
RDW: 12.6 % (ref 11.7–15.4)
WBC: 7.5 x10E3/uL (ref 3.4–10.8)

## 2024-02-15 LAB — COMPREHENSIVE METABOLIC PANEL WITH GFR
ALT: 19 IU/L (ref 0–32)
AST: 18 IU/L (ref 0–40)
Albumin: 4.3 g/dL (ref 3.8–4.9)
Alkaline Phosphatase: 84 IU/L (ref 49–135)
BUN/Creatinine Ratio: 16 (ref 9–23)
BUN: 10 mg/dL (ref 6–24)
Bilirubin Total: 0.4 mg/dL (ref 0.0–1.2)
CO2: 24 mmol/L (ref 20–29)
Calcium: 9.5 mg/dL (ref 8.7–10.2)
Chloride: 103 mmol/L (ref 96–106)
Creatinine, Ser: 0.64 mg/dL (ref 0.57–1.00)
Globulin, Total: 2 g/dL (ref 1.5–4.5)
Glucose: 97 mg/dL (ref 70–99)
Potassium: 4.6 mmol/L (ref 3.5–5.2)
Sodium: 141 mmol/L (ref 134–144)
Total Protein: 6.3 g/dL (ref 6.0–8.5)
eGFR: 103 mL/min/1.73 (ref >59)

## 2024-02-15 LAB — TSH: TSH: 4.19 u[IU]/mL (ref 0.450–4.500)

## 2024-02-15 LAB — LIPID PANEL
Chol/HDL Ratio: 2.8 ratio (ref 0.0–4.4)
Cholesterol, Total: 154 mg/dL (ref 100–199)
HDL: 55 mg/dL (ref >39)
LDL Chol Calc (NIH): 78 mg/dL (ref 0–99)
Triglycerides: 121 mg/dL (ref 0–149)
VLDL Cholesterol Cal: 21 mg/dL (ref 5–40)

## 2025-01-01 ENCOUNTER — Encounter: Admitting: Internal Medicine
